# Patient Record
Sex: Male | Born: 1980 | Race: White | Hispanic: No | Marital: Single | Smoking: Former smoker
Health system: Southern US, Community
[De-identification: ages and names within clinical notes are randomized; demographics above are authoritative.]

## PROBLEM LIST (undated history)

## (undated) DIAGNOSIS — K219 Gastro-esophageal reflux disease without esophagitis: Secondary | ICD-10-CM

## (undated) DIAGNOSIS — R55 Syncope and collapse: Secondary | ICD-10-CM

## (undated) DIAGNOSIS — M5432 Sciatica, left side: Secondary | ICD-10-CM

## (undated) DIAGNOSIS — M51369 Other intervertebral disc degeneration, lumbar region without mention of lumbar back pain or lower extremity pain: Secondary | ICD-10-CM

## (undated) DIAGNOSIS — M5126 Other intervertebral disc displacement, lumbar region: Secondary | ICD-10-CM

## (undated) DIAGNOSIS — M5136 Other intervertebral disc degeneration, lumbar region: Secondary | ICD-10-CM

## (undated) DIAGNOSIS — J36 Peritonsillar abscess: Secondary | ICD-10-CM

## (undated) DIAGNOSIS — I471 Supraventricular tachycardia, unspecified: Secondary | ICD-10-CM

## (undated) DIAGNOSIS — Z950 Presence of cardiac pacemaker: Secondary | ICD-10-CM

## (undated) DIAGNOSIS — Z8619 Personal history of other infectious and parasitic diseases: Secondary | ICD-10-CM

## (undated) HISTORY — DX: Other intervertebral disc degeneration, lumbar region: M51.36

## (undated) HISTORY — DX: Sciatica, left side: M54.32

## (undated) HISTORY — PX: WISDOM TOOTH EXTRACTION: SHX21

## (undated) HISTORY — DX: Other intervertebral disc displacement, lumbar region: M51.26

## (undated) HISTORY — PX: OTHER SURGICAL HISTORY: SHX169

## (undated) HISTORY — DX: Personal history of other infectious and parasitic diseases: Z86.19

## (undated) HISTORY — DX: Other intervertebral disc degeneration, lumbar region without mention of lumbar back pain or lower extremity pain: M51.369

## (undated) HISTORY — DX: Gastro-esophageal reflux disease without esophagitis: K21.9

---

## 2000-09-18 ENCOUNTER — Encounter: Payer: Self-pay | Admitting: Emergency Medicine

## 2000-09-18 ENCOUNTER — Emergency Department (HOSPITAL_COMMUNITY): Admission: AC | Admit: 2000-09-18 | Discharge: 2000-09-18 | Payer: Self-pay

## 2001-03-08 ENCOUNTER — Emergency Department (HOSPITAL_COMMUNITY): Admission: EM | Admit: 2001-03-08 | Discharge: 2001-03-08 | Payer: Self-pay

## 2006-08-25 ENCOUNTER — Emergency Department (HOSPITAL_COMMUNITY): Admission: EM | Admit: 2006-08-25 | Discharge: 2006-08-25 | Payer: Self-pay | Admitting: Emergency Medicine

## 2008-01-01 ENCOUNTER — Emergency Department (HOSPITAL_COMMUNITY): Admission: EM | Admit: 2008-01-01 | Discharge: 2008-01-01 | Payer: Self-pay | Admitting: Emergency Medicine

## 2008-08-11 ENCOUNTER — Emergency Department (HOSPITAL_COMMUNITY): Admission: EM | Admit: 2008-08-11 | Discharge: 2008-08-11 | Payer: Self-pay | Admitting: Emergency Medicine

## 2010-04-11 LAB — DIFFERENTIAL
Basophils Absolute: 0 10*3/uL (ref 0.0–0.1)
Basophils Relative: 0 % (ref 0–1)
Eosinophils Absolute: 0.1 10*3/uL (ref 0.0–0.7)
Eosinophils Relative: 1 % (ref 0–5)
Lymphocytes Relative: 22 % (ref 12–46)

## 2010-04-11 LAB — BASIC METABOLIC PANEL
BUN: 11 mg/dL (ref 6–23)
CO2: 27 mEq/L (ref 19–32)
GFR calc non Af Amer: >60 mL/min (ref >60)
Glucose, Bld: 86 mg/dL (ref 70–99)
Potassium: 4.1 mEq/L (ref 3.5–5.1)

## 2010-04-11 LAB — CBC
HCT: 42.7 % (ref 39.0–52.0)
MCHC: 33.5 g/dL (ref 30.0–36.0)
MCV: 89.8 fL (ref 78.0–100.0)
Platelets: 247 10*3/uL (ref 150–400)
RDW: 12.9 % (ref 11.5–15.5)

## 2010-04-11 LAB — GLUCOSE, CAPILLARY: Glucose-Capillary: 84 mg/dL (ref 70–99)

## 2011-11-22 DIAGNOSIS — Z8249 Family history of ischemic heart disease and other diseases of the circulatory system: Secondary | ICD-10-CM | POA: Insufficient documentation

## 2012-01-04 HISTORY — PX: PACEMAKER INSERTION: SHX728

## 2012-11-09 DIAGNOSIS — Z950 Presence of cardiac pacemaker: Secondary | ICD-10-CM | POA: Insufficient documentation

## 2013-03-18 DIAGNOSIS — I471 Supraventricular tachycardia: Secondary | ICD-10-CM | POA: Insufficient documentation

## 2013-03-18 DIAGNOSIS — I82409 Acute embolism and thrombosis of unspecified deep veins of unspecified lower extremity: Secondary | ICD-10-CM | POA: Insufficient documentation

## 2013-09-09 ENCOUNTER — Emergency Department (HOSPITAL_COMMUNITY)
Admission: EM | Admit: 2013-09-09 | Discharge: 2013-09-09 | Disposition: A | Discharge status: Home / Self Care | Payer: BC Managed Care – PPO | Attending: Emergency Medicine | Admitting: Emergency Medicine

## 2013-09-09 ENCOUNTER — Encounter (HOSPITAL_COMMUNITY): Payer: Self-pay | Admitting: Emergency Medicine

## 2013-09-09 DIAGNOSIS — Z79899 Other long term (current) drug therapy: Secondary | ICD-10-CM | POA: Insufficient documentation

## 2013-09-09 DIAGNOSIS — J029 Acute pharyngitis, unspecified: Secondary | ICD-10-CM | POA: Diagnosis present

## 2013-09-09 DIAGNOSIS — J36 Peritonsillar abscess: Secondary | ICD-10-CM | POA: Diagnosis not present

## 2013-09-09 DIAGNOSIS — I498 Other specified cardiac arrhythmias: Secondary | ICD-10-CM | POA: Insufficient documentation

## 2013-09-09 HISTORY — DX: Syncope and collapse: R55

## 2013-09-09 HISTORY — DX: Supraventricular tachycardia, unspecified: I47.10

## 2013-09-09 HISTORY — DX: Supraventricular tachycardia: I47.1

## 2013-09-09 MED ORDER — PENICILLIN G POTASSIUM 5000000 UNITS IJ SOLR
2.5000 10*6.[IU] | Freq: Once | INTRAVENOUS | Status: AC
  Administered 2013-09-09: 2.5 10*6.[IU] via INTRAVENOUS
  Filled 2013-09-09: qty 2.5

## 2013-09-09 MED ORDER — AMOXICILLIN 250 MG/5ML PO SUSR: 500.0000 mg | Freq: Three times a day (TID) | ORAL | Status: DC

## 2013-09-09 MED ORDER — AMOXICILLIN 250 MG/5ML PO SUSR: 250.0000 mg | Freq: Three times a day (TID) | ORAL | Status: DC

## 2013-09-09 MED ORDER — LIDOCAINE-EPINEPHRINE (PF) 1 %-1:200000 IJ SOLN
INTRAMUSCULAR | Status: AC
  Administered 2013-09-09: 12:00:00
  Filled 2013-09-09: qty 10

## 2013-09-09 MED ORDER — AMOXICILLIN 250 MG/5ML PO SUSR: 1000.0000 mg | Freq: Three times a day (TID) | ORAL | Status: DC

## 2013-09-09 MED ORDER — HYDROCODONE-ACETAMINOPHEN 7.5-325 MG/15ML PO SOLN: 10.0000 mL | ORAL | Status: DC | PRN

## 2013-09-09 MED ORDER — HYDROCODONE-ACETAMINOPHEN 7.5-325 MG/15ML PO SOLN: 15.0000 mL | ORAL | Status: DC | PRN

## 2013-09-09 MED ORDER — AMOXICILLIN 250 MG/5ML PO SUSR: ORAL | Status: DC

## 2013-09-09 NOTE — ED Provider Notes (Signed)
CSN: [id number]     Arrival date & time 09/09/13  0847 History   First MD Initiated Contact with Patient 09/09/13 [phone]     Chief Complaint  Patient presents with  . Sore Throat      HPI   patient complains of a sore throat for 11 days. Seen at urgent care 2 days ago and placed on 5 days of Biaxin. After 5 days was no better. Return. Placed on Levaquin.  Fifth dose of Levaquin last night he continues to have right-sided peritonsillar pain. Pain with swallowing. Minimal trismus. No fevers no chills. History of a peritonsillar abscess drained many years ago.  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 Otherwise healthy.  Past Medical History  Diagnosis Date  . SVT (supraventricular tachycardia)   . Vaso vagal episode    Past Surgical History  Procedure Laterality Date  . Pacemaker insertion     No family history on file. History  Substance Use Topics  . Smoking status: Never Smoker   . Smokeless tobacco: Not on file  . Alcohol Use: No    Review of Systems  Constitutional: Negative for fever, chills, diaphoresis, appetite change and fatigue.  HENT: Positive for sore throat, trouble swallowing and voice change. Negative for mouth sores.   Eyes: Negative for visual disturbance.  Respiratory: Negative for cough, chest tightness, shortness of breath and wheezing.   Cardiovascular: Negative for chest pain.  Gastrointestinal: Negative for nausea, vomiting, abdominal pain, diarrhea and abdominal distention.  Endocrine: Negative for polydipsia, polyphagia and polyuria.  Genitourinary: Negative for dysuria, frequency and hematuria.  Musculoskeletal: Negative for gait problem.  Skin: Negative for color change, pallor and rash.  Neurological: Negative for dizziness, syncope, light-headedness and headaches.  Hematological: Does not bruise/bleed easily.  Psychiatric/Behavioral: Negative for behavioral problems and confusion.      Allergies    Review of patient's allergies indicates no known allergies.  Home Medications   Prior to Admission medications   Medication Sig Start Date End Date Taking? Authorizing Provider  escitalopram (LEXAPRO) 10 MG tablet Take 10 mg by mouth daily.   Yes Historical Provider, MD  ibuprofen (ADVIL,MOTRIN) 200 MG tablet Take 400 mg by mouth every 6 (six) hours as needed for mild pain or moderate pain.   Yes Historical Provider, MD  risperiDONE (RISPERDAL) 0.5 MG tablet Take 0.5 mg by mouth at bedtime.   Yes Historical Provider, MD  amoxicillin (AMOXIL) 250 MG/5ML suspension Take 20 mLs (1,000 mg total) by mouth every 8 (eight) hours. 09/09/13   Flo Shanks, MD  amoxicillin (AMOXIL) 250 MG/5ML suspension 1g po tid x 3 days, then 750 mg po tid x 7 days  Disp QS x 9 days 09/09/13   Rolland Porter, MD  HYDROcodone-acetaminophen (HYCET) 7.5-325 mg/15 ml solution Take 10-20 mLs by mouth every 4 (four) hours as needed for moderate pain. 09/09/13   Flo Shanks, MD  HYDROcodone-acetaminophen (HYCET) 7.5-325 mg/15 ml solution Take 15 mLs by mouth every 4 (four) hours as needed for moderate pain or severe pain. 09/09/13   Rolland Porter, MD   BP 123/75  Pulse 69  Temp(Src) 98.5 F (36.9 C) (Oral)  Resp 16  SpO2 99% Physical Exam  Constitutional: He is oriented to person, place, and time. He appears well-developed and well-nourished. No distress.  HENT:  Head: Normocephalic.  Mouth/Throat:    Eyes: Conjunctivae are normal. Pupils are equal, round, and reactive to light. No scleral icterus.  Neck: Normal  range of motion. Neck supple. No thyromegaly present.  Cardiovascular: Normal rate and regular rhythm.  Exam reveals no gallop and no friction rub.   No murmur heard. Pulmonary/Chest: Effort normal and breath sounds normal. No respiratory distress. He has no wheezes. He has no rales.  Abdominal: Soft. Bowel sounds are normal. He exhibits no distension. There is no tenderness. There is no rebound.  Musculoskeletal:  Normal range of motion.  Neurological: He is alert and oriented to person, place, and time.  Skin: Skin is warm and dry. No rash noted.  Psychiatric: He has a normal mood and affect. His behavior is normal.    ED Course  Procedures (including critical care time) Labs Review Labs Reviewed - No data to display  Imaging Review No results found.   EKG Interpretation None      MDM   Final diagnoses:  Peritonsillar abscess   IV is placed. Given IV penicillin. Discussed case with Dr. Lazarus Salines. He is in route planning incision and drainage.    Rolland Porter, MD 09/10/13 [phone]

## 2013-09-09 NOTE — Discharge Instructions (Signed)
Call Dr. Lazarus Salines for follow up appointment.  Peritonsillar Cellulitis Peritonsillar cellulitis is an infection around a tonsil. This infection usually affects just one of the two tonsils. The result is a severe sore throat. Peritonsillar cellulitis can develop at any age. It often develops in individuals who have had frequent sore throats and who have frequently taken antibiotics. CAUSES  Peritonsillar cellulitis is usually caused by more than one type of germ (bacteria). SYMPTOMS  At first, it might seem like a regular sore throat. But a sore throat from peritonsillar cellulitis does not go away in a few days. Instead, it gets worse.  Early symptoms of peritonsillar cellulitis may include:  Fever and/or chills.  A throat that is sore on one side only.  Pain in one ear.  Pain when swallowing.  Feeling more tired than usual.  Later symptoms may include:  Severe pain when swallowing.  Drooling.  Trouble opening the mouth wide.  Bad breath.  Voice changes. DIAGNOSIS  In most cases, your caregiver can make the diagnosis by knowing your symptoms, examining your throat and getting a throat culture. Blood samples may also help to determine the cause of your sore throat. TREATMENT  This is not an ordinary sore throat. It is a condition that needs to be treated quickly. If it is not treated, swelling and pus (an abscess) can develop.  Peritonsillar cellulitis is usually treated with antibiotics. These infections require oral antibiotics for a full 10 days and/or antibiotics given into the vein (intravenous, IV).  Medications may be prescribed for pain or fever.  Sometimes, medications that fight swelling (steroids) are prescribed.  If an abscess has formed, the abscess may need to be drained.  Individuals who have repeated cases of peritonsillar cellulitis may need an operation to remove the tonsils (tonsillectomy). HOME CARE INSTRUCTIONS   Take antibiotics as directed by  your caregiver. Take all the antibiotics, even if you start to feel better.  Some pain is normal with this condition. Take pain medication as directed by your caregiver. Do not take any other pain medications unless approved by your caregiver.  Gargle with warm salt water. Use 1 teaspoon (5 grams) salt mixed in 1 cup (250 mL) of warm water. Gargle for 30 seconds or more before spitting the solution out. Gargle 3 to 4 times a day or as needed. This may help ease pain and swelling.  A liquid or soft food diet may be necessary if it is hard to swallow.  It is important to drink fluids. Drink enough water and fluids to keep your urine clear or pale yellow.  Do not smoke.  Rest and get plenty of sleep.  If your caregiver has given you a follow-up appointment, it is very important to keep that appointment. Your caregiver will need to make sure that the infection is getting better. It is important to check that an abscess is not forming.  Return to work or school as directed by your caregiver. SEEK MEDICAL CARE IF:   Your swelling increases.  You have difficulty swallowing.  You are unable to take your antibiotic. SEEK IMMEDIATE MEDICAL CARE IF:   You have trouble breathing.  Your pain gets worse even after taking pain medicine.  You see pus around or near the tonsils.  Your voice changes.  You are drooling.  You cough up bloody sputum.  You are unable to swallow.  You have a fever. MAKE SURE YOU:   Understand these instructions.  Will watch your condition.  Will get help right away if you are not doing well or get worse. Document Released: 03/16/2009 Document Revised: 05/06/2013 Document Reviewed: 03/16/2009 [facility] Patient Information 2015 [place], [place]. This information is not intended to replace advice given to you by your health care provider. Make sure you discuss any questions you have with your health care provider.  Liquid hydrocodone with tylenol for pain  relief Liquid Amoxicillin antibiotic Recheck my office 2 weeks ([phone]) Call for bleeding, worsening of throat pain Return to work/school tomorrow or Wednesday

## 2013-09-09 NOTE — ED Notes (Signed)
ENT MD at bedside.

## 2013-09-09 NOTE — Consult Note (Signed)
Keith Underwood, Keith Underwood 33 y.o., male [id number]     Chief Complaint:  RIGHT peritonsillar abscess  HPI: 33 yo wm, onset URI 12 days ago, then diffuse sore throat.  Localized to RIGHT side x 9-10 days.  Painful to swallow.  No breathing difficulty.  Seen twice at Urgent Care.  Given Biaxin with no relief, then Levaquin also with no relief.  No documented fever.  Pt.  Has past hx PTA, RIGHT, I&D'd ~15 yrs ago. Also one or two episodes tonsillitis per year.  Has recent pacemaker for vasovagal syncope.  PMH: Past Medical History  Diagnosis Date  . SVT (supraventricular tachycardia)   . Vaso vagal episode     Surg Hx: Past Surgical History  Procedure Laterality Date  . Pacemaker insertion      FHx:  No family history on file. SocHx:  reports that he has never smoked. He does not have any smokeless tobacco history on file. He reports that he does not drink alcohol. His drug history is not on file.  ALLERGIES: No Known Allergies   (Not in a hospital admission)  No results found for this or any previous visit (from the past 48 hour(s)). No results found.  ROS:No SOB, chest pain.   Blood pressure 123/76, pulse 73, temperature 98.7 F (37.1 C), temperature source Oral, resp. rate 16, SpO2 97.00%.  PHYSICAL EXAM: Overall appearance:  Distressed. No hot potato voice, no significant fetor oris.  Not warm to touch. Overall physically robust Head:  NCAT Ears:  clear Nose:  clear Oral Cavity:  Bulging RIGHT soft palate. 2+ tonsils Oral Pharynx/Hypopharynx/Larynx:  See above Neuro:  Grossly intact Neck:  Tender RIGHT JDG nodes  Studies Reviewed: none    Assessment/Plan RIGHT peritonsillar abscess  With informed consent, anesthetized the RIGHT peritonsillar area with Cetacaine spray, followed by 6 ml 2 % xylocaine with 1:200,000 epi.  Several minutes were allowed for this to take effect.    A 2 cm crescent incision was performed.  Frank pus encountered, 15-20 ml estimated.   Cavity widely opened.    Pt tolerated well.  Hemostasis spontaneous.    Will send home with liquid Amoxicillin and liquid hydrocodone.  Recheck my office 2 weeks.  Lazarus Salines, Daralyn Bert 09/09/2013, 11:30 AM

## 2013-09-09 NOTE — ED Notes (Signed)
Pt c/o right side throat pain, hurts to touch and swallow x 1 week. Pt coughing up clear sputum.

## 2013-10-22 ENCOUNTER — Emergency Department (HOSPITAL_COMMUNITY): Payer: BC Managed Care – PPO

## 2013-10-22 ENCOUNTER — Encounter (HOSPITAL_COMMUNITY): Payer: Self-pay | Admitting: Emergency Medicine

## 2013-10-22 ENCOUNTER — Emergency Department (HOSPITAL_COMMUNITY)
Admission: EM | Admit: 2013-10-22 | Discharge: 2013-10-22 | Disposition: A | Discharge status: Home / Self Care | Payer: BC Managed Care – PPO | Attending: Emergency Medicine | Admitting: Emergency Medicine

## 2013-10-22 DIAGNOSIS — Y93E1 Activity, personal bathing and showering: Secondary | ICD-10-CM | POA: Insufficient documentation

## 2013-10-22 DIAGNOSIS — Y9289 Other specified places as the place of occurrence of the external cause: Secondary | ICD-10-CM | POA: Diagnosis not present

## 2013-10-22 DIAGNOSIS — W19XXXA Unspecified fall, initial encounter: Secondary | ICD-10-CM

## 2013-10-22 DIAGNOSIS — W182XXA Fall in (into) shower or empty bathtub, initial encounter: Secondary | ICD-10-CM | POA: Insufficient documentation

## 2013-10-22 DIAGNOSIS — Z8679 Personal history of other diseases of the circulatory system: Secondary | ICD-10-CM | POA: Insufficient documentation

## 2013-10-22 DIAGNOSIS — Z79899 Other long term (current) drug therapy: Secondary | ICD-10-CM | POA: Diagnosis not present

## 2013-10-22 DIAGNOSIS — S3992XA Unspecified injury of lower back, initial encounter: Secondary | ICD-10-CM | POA: Diagnosis present

## 2013-10-22 DIAGNOSIS — W01198A Fall on same level from slipping, tripping and stumbling with subsequent striking against other object, initial encounter: Secondary | ICD-10-CM | POA: Diagnosis not present

## 2013-10-22 DIAGNOSIS — R531 Weakness: Secondary | ICD-10-CM | POA: Diagnosis not present

## 2013-10-22 DIAGNOSIS — M5432 Sciatica, left side: Secondary | ICD-10-CM

## 2013-10-22 DIAGNOSIS — R202 Paresthesia of skin: Secondary | ICD-10-CM | POA: Diagnosis not present

## 2013-10-22 MED ORDER — OXYCODONE-ACETAMINOPHEN 5-325 MG PO TABS: 1.0000 | ORAL_TABLET | Freq: Four times a day (QID) | ORAL | Status: DC | PRN

## 2013-10-22 MED ORDER — IBUPROFEN 600 MG PO TABS: 600.0000 mg | ORAL_TABLET | Freq: Four times a day (QID) | ORAL | Status: DC | PRN

## 2013-10-22 MED ORDER — KETOROLAC TROMETHAMINE 30 MG/ML IJ SOLN
30.0000 mg | Freq: Once | INTRAMUSCULAR | Status: AC
  Administered 2013-10-22: 30 mg via INTRAVENOUS
  Filled 2013-10-22: qty 1

## 2013-10-22 MED ORDER — HYDROMORPHONE HCL 1 MG/ML IJ SOLN
1.0000 mg | Freq: Once | INTRAMUSCULAR | Status: AC
  Administered 2013-10-22: 1 mg via INTRAVENOUS
  Filled 2013-10-22: qty 1

## 2013-10-22 NOTE — ED Provider Notes (Signed)
CSN: [id number]     Arrival date & time 10/22/13  1050 History   First MD Initiated Contact with Patient 10/22/13 1104     Chief Complaint  Patient presents with  . Fall     (Consider location/radiation/quality/duration/timing/severity/associated sxs/prior Treatment) HPI  This is a 33 year old male who presents following a fall. He states he slipped and fell when he came out of the shower. He hit lower back and immediately had onset of pain and paresthesias down his left leg. He has not attempted to the ambulatory. He did not hit his head or lose consciousness. He denies any syncope, chest pain, shortness of breath during the event.  He reports difficulty moving his left lower extremity. He rates his pain at 10. He has a history of degenerative disc disease and was followed by orthopedics in [place].  Denies loss of bowel or bladder.  Past Medical History  Diagnosis Date  . SVT (supraventricular tachycardia)   . Vaso vagal episode    Past Surgical History  Procedure Laterality Date  . Pacemaker insertion     No family history on file. History  Substance Use Topics  . Smoking status: Never Smoker   . Smokeless tobacco: Not on file  . Alcohol Use: No    Review of Systems  Respiratory: Negative.  Negative for chest tightness and shortness of breath.   Cardiovascular: Negative.  Negative for chest pain.  Gastrointestinal: Negative.   Genitourinary: Negative.   Musculoskeletal: Positive for back pain.  Skin: Negative for rash.  Neurological: Positive for weakness and numbness. Negative for headaches.  All other systems reviewed and are negative.     Allergies  Review of patient's allergies indicates no known allergies.  Home Medications   Prior to Admission medications   Medication Sig Start Date End Date Taking? Authorizing Provider  FLUoxetine (PROZAC) 10 MG capsule Take 10 mg by mouth at bedtime.   Yes Historical Provider, MD  ibuprofen (ADVIL,MOTRIN) 200 MG tablet  Take 400 mg by mouth every 6 (six) hours as needed for headache, mild pain or moderate pain.    Yes Historical Provider, MD  risperiDONE (RISPERDAL) 0.5 MG tablet Take 0.5 mg by mouth at bedtime.   Yes Historical Provider, MD  ibuprofen (ADVIL,MOTRIN) 600 MG tablet Take 1 tablet (600 mg total) by mouth every 6 (six) hours as needed. 10/22/13   Shon Batonourtney F Pepe Mineau, MD  oxyCODONE-acetaminophen (PERCOCET/ROXICET) 5-325 MG per tablet Take 1-2 tablets by mouth every 6 (six) hours as needed for moderate pain or severe pain. 10/22/13   Shon Batonourtney F Amaria Mundorf, MD   BP 130/75  Pulse 63  Temp(Src) 97.8 F (36.6 C) (Oral)  Resp 16  SpO2 100% Physical Exam  Nursing note and vitals reviewed. Constitutional: He is oriented to person, place, and time. He appears well-developed and well-nourished. No distress.  HENT:  Head: Normocephalic and atraumatic.  Eyes: Pupils are equal, round, and reactive to light.  Cardiovascular: Normal rate, regular rhythm and normal heart sounds.   No murmur heard. Pulmonary/Chest: Effort normal and breath sounds normal. No respiratory distress. He has no wheezes.  Abdominal: Soft. Bowel sounds are normal. There is no tenderness. There is no rebound.  Genitourinary:  Normal rectal tone  Musculoskeletal: He exhibits no edema.  Normal passive range of motion of the bilateral hips and knees, no clonus noted, decreased strength in the left lower extremity with dorsi and plantar flexion as well as hip flexion which appears to be somewhat functional  Neurological: He is alert and oriented to person, place, and time.  Skin: Skin is warm and dry.  Psychiatric: He has a normal mood and affect.    ED Course  Procedures (including critical care time) Labs Review Labs Reviewed - No data to display  Imaging Review Dg Pelvis 1-2 Views  10/22/2013   CLINICAL DATA:  33 year old male with fall in shower and low back pain.  EXAM: PELVIS - 1-2 VIEW  COMPARISON:  Contemporaneous spine CT   FINDINGS: Bony pelvic ring is intact without acute bony abnormality. Bilateral hips projects normally over the acetabula. Visualized proximal femurs unremarkable. Unremarkable appearance of the sacroiliac joints and the lower lumbar spine.  Unremarkable bowel gas pattern.  IMPRESSION: No acute abnormality.  Signed,  Yvone Neu. Loreta Ave, DO  [facility]  [facility]   Electronically Signed   By: Gilmer Mor D.O.   On: 10/22/2013 13:13   Ct Thoracic Spine Wo Contrast  10/22/2013   CLINICAL DATA:  Slipped and fell in the bathroom. Severe back and left leg pain and left leg numbness.  EXAM: CT THORACIC AND LUMBAR SPINE WITHOUT CONTRAST  TECHNIQUE: Multidetector CT imaging of the thoracic and lumbar spine was performed without contrast. Multiplanar CT image reconstructions were also generated.  COMPARISON:  None.  FINDINGS: CT THORACIC SPINE FINDINGS  Normal alignment of the thoracic vertebral bodies. No acute compression fracture is identified. The facets are normally aligned. No posterior element fractures. No abnormal paraspinal soft tissue swelling or hematoma. The visualized posterior ribs are intact and the visualized lungs are clear.  CT LUMBAR SPINE FINDINGS  The lumbar vertebral bodies are normally aligned. No acute compression fracture. The facets are normally aligned. No posterior element fractures. No pars defects. No abnormal paraspinal soft tissue swelling. No retroperitoneal findings. No large disc protrusions, spinal or foraminal stenosis. There are shallow broad-based disc protrusions at L4-5 and L5-S1 with mild impression on the thecal sac. No foraminal stenosis.  IMPRESSION: CT THORACIC SPINE IMPRESSION  Normal alignment and no acute bony findings.  CT LUMBAR SPINE IMPRESSION  Normal alignment and no acute bony findings.  Shallow broad-based disc protrusions at L4-5 and L5-S1.   Electronically Signed   By: Loralie Champagne M.D.   On: 10/22/2013 12:02    Ct Lumbar Spine Wo Contrast  10/22/2013   CLINICAL DATA:  Slipped and fell in the bathroom. Severe back and left leg pain and left leg numbness.  EXAM: CT THORACIC AND LUMBAR SPINE WITHOUT CONTRAST  TECHNIQUE: Multidetector CT imaging of the thoracic and lumbar spine was performed without contrast. Multiplanar CT image reconstructions were also generated.  COMPARISON:  None.  FINDINGS: CT THORACIC SPINE FINDINGS  Normal alignment of the thoracic vertebral bodies. No acute compression fracture is identified. The facets are normally aligned. No posterior element fractures. No abnormal paraspinal soft tissue swelling or hematoma. The visualized posterior ribs are intact and the visualized lungs are clear.  CT LUMBAR SPINE FINDINGS  The lumbar vertebral bodies are normally aligned. No acute compression fracture. The facets are normally aligned. No posterior element fractures. No pars defects. No abnormal paraspinal soft tissue swelling. No retroperitoneal findings. No large disc protrusions, spinal or foraminal stenosis. There are shallow broad-based disc protrusions at L4-5 and L5-S1 with mild impression on the thecal sac. No foraminal stenosis.  IMPRESSION: CT THORACIC SPINE IMPRESSION  Normal alignment and no acute bony findings.  CT LUMBAR SPINE IMPRESSION  Normal alignment and no acute bony findings.  Shallow broad-based disc protrusions at L4-5 and L5-S1.   Electronically Signed   By: Loralie ChampagneMark  Gallerani M.D.   On: 10/22/2013 12:02     EKG Interpretation None      MDM   Final diagnoses:  Fall, initial encounter  Sciatica, left    Patient presents with a fall. Reports hitting his back and now reports weakness and numbness in the left lower extremity. Decreased strength in the left lower extremity which may be effort dependent. Patient has good rectal tone. Given complaint however, will obtain CT T. and L-spine to rule out acute fracture causing cord compression. CTs are reassuring. They do note  disc protrusions at L4, L5, and S1. On repeat exam, patient noted to spontaneously move left lower extremity. No other obvious injury on exam. He was given pain control and was able to ambulate. No evidence of cauda equina at this time. No indication for emergent MRI.  Discuss with patient pain control at home. He was referred to primary doctor as well as an orthopedist in [place] per his request.  After history, exam, and medical workup I feel the patient has been appropriately medically screened and is safe for discharge home. Pertinent diagnoses were discussed with the patient. Patient was given return precautions.     Shon Batonourtney F Harwood Nall, MD 10/23/13 [phone]

## 2013-10-22 NOTE — ED Notes (Signed)
Pt. Walk well in the hallway with two person assist. He stated that his leg felt numb but he was able to put pressure on it. As he walked he was having lower back pain.

## 2013-10-22 NOTE — ED Notes (Signed)
Bed: WA21 Expected date:  Expected time:  Means of arrival:  Comments: ems- fell, back pain. Radiates up spine, some time of pacemaker

## 2013-10-22 NOTE — ED Notes (Signed)
Pt states slipped in water; laid in floor x 10 minutes then called 911; c/o left leg feeling numb; states feels cold and tingly; denies pain; denies any other injury; states didn't hit anything on the way down; denies loc; states landed on lower back; no obvious injury noted; Dr Wilkie AyeHorton states ok to remove from lsb; pt removed from lsb with assistance; no obvious injury noted; no bruising or deformity noted; no crepitus or step off noted down spinal column; pt tolerated without difficulty; pt skin warm and dry; talkative and pleasant; c collar remains in place; pt feels need to void; urinal given

## 2013-10-22 NOTE — Discharge Instructions (Signed)
Sciatica Sciatica is pain, weakness, numbness, or tingling along the path of the sciatic nerve. The nerve starts in the lower back and runs down the back of each leg. The nerve controls the muscles in the lower leg and in the back of the knee, while also providing sensation to the back of the thigh, lower leg, and the sole of your foot. Sciatica is a symptom of another medical condition. For instance, nerve damage or certain conditions, such as a herniated disk or bone spur on the spine, pinch or put pressure on the sciatic nerve. This causes the pain, weakness, or other sensations normally associated with sciatica. Generally, sciatica only affects one side of the body. CAUSES   Herniated or slipped disc.  Degenerative disk disease.  A pain disorder involving the narrow muscle in the buttocks (piriformis syndrome).  Pelvic injury or fracture.  Pregnancy.  Tumor (rare). SYMPTOMS  Symptoms can vary from mild to very severe. The symptoms usually travel from the low back to the buttocks and down the back of the leg. Symptoms can include:  Mild tingling or dull aches in the lower back, leg, or hip.  Numbness in the back of the calf or sole of the foot.  Burning sensations in the lower back, leg, or hip.  Sharp pains in the lower back, leg, or hip.  Leg weakness.  Severe back pain inhibiting movement. These symptoms may get worse with coughing, sneezing, laughing, or prolonged sitting or standing. Also, being overweight may worsen symptoms. DIAGNOSIS  Your caregiver will perform a physical exam to look for common symptoms of sciatica. He or she may ask you to do certain movements or activities that would trigger sciatic nerve pain. Other tests may be performed to find the cause of the sciatica. These may include:  Blood tests.  X-rays.  Imaging tests, such as an MRI or CT scan. TREATMENT  Treatment is directed at the cause of the sciatic pain. Sometimes, treatment is not necessary  and the pain and discomfort goes away on its own. If treatment is needed, your caregiver may suggest:  Over-the-counter medicines to relieve pain.  Prescription medicines, such as anti-inflammatory medicine, muscle relaxants, or narcotics.  Applying heat or ice to the painful area.  Steroid injections to lessen pain, irritation, and inflammation around the nerve.  Reducing activity during periods of pain.  Exercising and stretching to strengthen your abdomen and improve flexibility of your spine. Your caregiver may suggest losing weight if the extra weight makes the back pain worse.  Physical therapy.  Surgery to eliminate what is pressing or pinching the nerve, such as a bone spur or part of a herniated disk. HOME CARE INSTRUCTIONS   Only take over-the-counter or prescription medicines for pain or discomfort as directed by your caregiver.  Apply ice to the affected area for 20 minutes, 3-4 times a day for the first 48-72 hours. Then try heat in the same way.  Exercise, stretch, or perform your usual activities if these do not aggravate your pain.  Attend physical therapy sessions as directed by your caregiver.  Keep all follow-up appointments as directed by your caregiver.  Do not wear high heels or shoes that do not provide proper support.  Check your mattress to see if it is too soft. A firm mattress may lessen your pain and discomfort. SEEK IMMEDIATE MEDICAL CARE IF:   You lose control of your bowel or bladder (incontinence).  You have increasing weakness in the lower back, pelvis, buttocks,   or legs.  You have redness or swelling of your back.  You have a burning sensation when you urinate.  You have pain that gets worse when you lie down or awakens you at night.  Your pain is worse than you have experienced in the past.  Your pain is lasting longer than 4 weeks.  You are suddenly losing weight without reason. MAKE SURE YOU:  Understand these  instructions.  Will watch your condition.  Will get help right away if you are not doing well or get worse. Document Released: 12/14/2000 Document Revised: 06/21/2011 Document Reviewed: 05/01/2011 [employer] Patient Information 2015 [employer]. This information is not intended to replace advice given to you by your health care provider. Make sure you discuss any questions you have with your health care provider.  

## 2013-10-22 NOTE — ED Notes (Signed)
Misty StanleyLisa in [place] notified of need to do CT scan ASAP; strict spine precautions noted per Dr Wilkie AyeHorton

## 2013-10-22 NOTE — ED Notes (Signed)
Per EMS; pt in bathroom slipped and fell in water; called ems due to c/o severe pain in mid thoracic down to lower lumbar region; numbness in left leg; history of back pain; patient can feel pressure; feels like leg is asleep; feels tingly; pt fully immobilized on arrival; neg loc; neg neck; no deformity or crepitus noted per EMS

## 2014-09-19 ENCOUNTER — Encounter: Payer: Self-pay | Admitting: Physician Assistant

## 2014-09-19 ENCOUNTER — Ambulatory Visit (INDEPENDENT_AMBULATORY_CARE_PROVIDER_SITE_OTHER): Payer: Managed Care, Other (non HMO) | Admitting: Physician Assistant

## 2014-09-19 VITALS — BP 124/82 | HR 97 | Temp 98.3°F | Resp 16 | Ht 70.5 in | Wt 198.0 lb

## 2014-09-19 DIAGNOSIS — F319 Bipolar disorder, unspecified: Secondary | ICD-10-CM | POA: Insufficient documentation

## 2014-09-19 DIAGNOSIS — Z23 Encounter for immunization: Secondary | ICD-10-CM | POA: Diagnosis not present

## 2014-09-19 DIAGNOSIS — I471 Supraventricular tachycardia: Secondary | ICD-10-CM

## 2014-09-19 DIAGNOSIS — F3130 Bipolar disorder, current episode depressed, mild or moderate severity, unspecified: Secondary | ICD-10-CM | POA: Diagnosis not present

## 2014-09-19 DIAGNOSIS — Z0000 Encounter for general adult medical examination without abnormal findings: Secondary | ICD-10-CM

## 2014-09-19 LAB — CBC
HCT: 45.9 % (ref 39.0–52.0)
Hemoglobin: 15.5 g/dL (ref 13.0–17.0)
MCHC: 33.8 g/dL (ref 30.0–36.0)
MCV: 90.6 fl (ref 78.0–100.0)
PLATELETS: 298 10*3/uL (ref 150.0–400.0)
RBC: 5.07 Mil/uL (ref 4.22–5.81)
RDW: 13.2 % (ref 11.5–15.5)
WBC: 8.1 10*3/uL (ref 4.0–10.5)

## 2014-09-19 LAB — LIPID PANEL
CHOLESTEROL: 164 mg/dL (ref 0–200)
HDL: 51.4 mg/dL (ref >39.00)
LDL Cholesterol: 89 mg/dL (ref 0–99)
NonHDL: 112.28
Total CHOL/HDL Ratio: 3
Triglycerides: 115 mg/dL (ref 0.0–149.0)
VLDL: 23 mg/dL (ref 0.0–40.0)

## 2014-09-19 LAB — COMPREHENSIVE METABOLIC PANEL
ALT: 24 U/L (ref 0–53)
AST: 22 U/L (ref 0–37)
Albumin: 4.8 g/dL (ref 3.5–5.2)
Alkaline Phosphatase: 79 U/L (ref 39–117)
BUN: 13 mg/dL (ref 6–23)
CALCIUM: 10.7 mg/dL — AB (ref 8.4–10.5)
CHLORIDE: 100 meq/L (ref 96–112)
CO2: 32 meq/L (ref 19–32)
CREATININE: 0.91 mg/dL (ref 0.40–1.50)
GFR: 100.77 mL/min (ref >60.00)
Glucose, Bld: 82 mg/dL (ref 70–99)
POTASSIUM: 4.6 meq/L (ref 3.5–5.1)
Sodium: 138 mEq/L (ref 135–145)
Total Bilirubin: 0.4 mg/dL (ref 0.2–1.2)
Total Protein: 7.7 g/dL (ref 6.0–8.3)

## 2014-09-19 LAB — URINALYSIS, ROUTINE W REFLEX MICROSCOPIC
BILIRUBIN URINE: NEGATIVE
HGB URINE DIPSTICK: NEGATIVE
KETONES UR: NEGATIVE
LEUKOCYTES UA: NEGATIVE
NITRITE: NEGATIVE
Specific Gravity, Urine: <=1.005 — AB (ref 1.000–1.030)
Total Protein, Urine: NEGATIVE
UROBILINOGEN UA: 0.2 (ref 0.0–1.0)
Urine Glucose: NEGATIVE
WBC UA: NONE SEEN (ref 0–?)
pH: 6.5 (ref 5.0–8.0)

## 2014-09-19 LAB — TSH: TSH: 0.71 u[IU]/mL (ref 0.35–4.50)

## 2014-09-19 LAB — HEMOGLOBIN A1C: Hgb A1c MFr Bld: 5.1 % (ref 4.6–6.5)

## 2014-09-19 NOTE — Assessment & Plan Note (Signed)
Pacemaker in place. Followed by Cardiology. Encouraged patient to follow-up with specialist as scheduled.

## 2014-09-19 NOTE — Assessment & Plan Note (Signed)
Stable. No manic episodes. Depression predominant. Denies SI/HI. Is followed by Psychiatry. Continue current regimen.

## 2014-09-19 NOTE — Progress Notes (Signed)
Pre visit review using our clinic review tool, if applicable. No additional management support is needed unless otherwise documented below in the visit note/SLS  

## 2014-09-19 NOTE — Progress Notes (Signed)
Patient presents to clinic today to establish care. Is needing a CPE. Is fasting for labs.  Chronic Issues: HX of SVT -- Followed by Cardiology (Dr. Rica Mote [facility]). Pacemaker in place. Patient denies chest pain, palpitations, lightheadedness, dizziness, vision changes or frequent headaches.  Bipolar Disorder I, Depression predominant -- Followed by Psychiatry at [facility]. Is currently on combination of Lamictal and Fluoxetine with good relief of symptoms. Denies SI/HI.  ADD -- Followed by Psychiatry. Endorses doing well with Adderall 20 mg BID. Denies side effects of medication.  Health Maintenance: Immunizations -- Tetanus in 2010 per patient. Flu shot will be given today.   Past Medical History  Diagnosis Date  . SVT (supraventricular tachycardia)     Pacemaker  . Vaso vagal episode   . History of chicken pox   . Acid reflux   . Sciatica of left side   . Bulging lumbar disc     L5    Past Surgical History  Procedure Laterality Date  . Pacemaker insertion  2014  . Wisdom tooth extraction    . Spinal injections      Current Outpatient Prescriptions on File Prior to Visit  Medication Sig Dispense Refill  . ibuprofen (ADVIL,MOTRIN) 200 MG tablet Take 400 mg by mouth every 6 (six) hours as needed for headache, mild pain or moderate pain.      No current facility-administered medications on file prior to visit.    No Known Allergies  Family History  Problem Relation Age of Onset  . Hypertension Mother     Living  . Other Mother     Hyperglycemia  . Cancer Father     Living - Leukemia  . Heart attack Father   . Heart disease Father   . Cancer Maternal Grandfather     Leukemia  . Cancer Paternal Grandmother     Oral Cancer  . Lung cancer Paternal Uncle     x4  . Thyroid disease Sister     #1  . Gallstones Sister     #2  . Allergies Son   . Healthy Daughter     x1    Social History   Social History  . Marital Status: Single    Spouse  Name: N/A  . Number of Children: 2  . Years of Education: N/A   Occupational History  . 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 Smoking status: Former Smoker    Types: Cigarettes  . Smokeless tobacco: Never Used     Comment: QUIT 6 YEARS AGO  . Alcohol Use: No  . Drug Use: No  . Sexual Activity:    Partners: Female   Other Topics Concern  . Not on file   Social History Narrative    Review of Systems  Constitutional: Negative for fever and weight loss.  HENT: Negative for ear discharge, ear pain, hearing loss and tinnitus.   Eyes: Negative for blurred vision, double vision, photophobia and pain.  Respiratory: Negative for cough and shortness of breath.   Cardiovascular: Negative for chest pain and palpitations.  Gastrointestinal: Negative for heartburn, nausea, vomiting, abdominal pain, diarrhea, constipation, blood in stool and melena.  Genitourinary: Negative for dysuria, urgency, frequency, hematuria and flank pain.  Musculoskeletal: Negative for falls.  Neurological: Negative for dizziness, loss of consciousness and headaches.  Endo/Heme/Allergies: Negative for environmental allergies.  Psychiatric/Behavioral: Negative for depression, suicidal ideas, hallucinations and substance abuse. The patient is not  nervous/anxious and does not have insomnia.     BP 124/82 mmHg  Pulse 97  Temp(Src) 98.3 F (36.8 C) (Oral)  Resp 16  Ht 5' 10.5" (1.791 m)  Wt 198 lb (89.812 kg)  BMI 28.00 kg/m2  SpO2 98%  Physical Exam  Constitutional: He is oriented to person, place, and time and well-developed, well-nourished, and in no distress.  HENT:  Head: Normocephalic and atraumatic.  Right Ear: External ear normal.  Left Ear: External ear normal.  Nose: Nose normal.  Mouth/Throat: Oropharynx is clear and moist. No oropharyngeal exudate.  Eyes: Conjunctivae and EOM are normal. Pupils are equal, round, and reactive to light.  Neck: Neck supple. No  thyromegaly present.  Cardiovascular: Normal rate, regular rhythm, normal heart sounds and intact distal pulses.   Pulmonary/Chest: Effort normal and breath sounds normal. No respiratory distress. He has no wheezes. He has no rales. He exhibits no tenderness.  Abdominal: Soft. Bowel sounds are normal. He exhibits no distension and no mass. There is no tenderness. There is no rebound and no guarding.  Genitourinary: Testes/scrotum normal.  Deferred.  Lymphadenopathy:    He has no cervical adenopathy.  Neurological: He is alert and oriented to person, place, and time.  Skin: Skin is warm and dry. No rash noted.  Psychiatric: Affect normal.  Vitals reviewed.   No results found for this or any previous visit (from the past 2160 hour(s)).  Assessment/Plan: Visit for preventive health examination Depression screen performed. Health Maintenance reviewed -- Flu shot given today. Preventive schedule discussed and handout given in AVS. Will obtain fasting labs today.   Supraventricular tachycardia Pacemaker in place. Followed by Cardiology. Encouraged patient to follow-up with specialist as scheduled.  Bipolar disorder with depression Stable. No manic episodes. Depression predominant. Denies SI/HI. Is followed by Psychiatry. Continue current regimen.       By

## 2014-09-19 NOTE — Assessment & Plan Note (Signed)
Depression screen performed. Health Maintenance reviewed -- Flu shot given today. Preventive schedule discussed and handout given in AVS. Will obtain fasting labs today.

## 2014-09-19 NOTE — Patient Instructions (Signed)
Please continue your medications as directed. Follow-up with your specialists as scheduled.  Please go to the lab for blood work.  I will call you with your results. If your blood work is normal we will follow-up yearly for physicals.  If anything is abnormal we will treat accordingly and get you in for follow-up.  Preventive Care for Adults A healthy lifestyle and preventive care can promote health and wellness. Preventive health guidelines for men include the following key practices:  A routine yearly physical is a good way to check with your health care provider about your health and preventative screening. It is a chance to share any concerns and updates on your health and to receive a thorough exam.  Visit your dentist for a routine exam and preventative care every 6 months. Brush your teeth twice a day and floss once a day. Good oral hygiene prevents tooth decay and gum disease.  The frequency of eye exams is based on your age, health, family medical history, use of contact lenses, and other factors. Follow your health care provider's recommendations for frequency of eye exams.  Eat a healthy diet. Foods such as vegetables, fruits, whole grains, low-fat dairy products, and lean protein foods contain the nutrients you need without too many calories. Decrease your intake of foods high in solid fats, added sugars, and salt. Eat the right amount of calories for you.Get information about a proper diet from your health care provider, if necessary.  Regular physical exercise is one of the most important things you can do for your health. Most adults should get at least 150 minutes of moderate-intensity exercise (any activity that increases your heart rate and causes you to sweat) each week. In addition, most adults need muscle-strengthening exercises on 2 or more days a week.  Maintain a healthy weight. The body mass index (BMI) is a screening tool to identify possible weight problems. It  provides an estimate of body fat based on height and weight. Your health care provider can find your BMI and can help you achieve or maintain a healthy weight.For adults 20 years and older:  A BMI below 18.5 is considered underweight.  A BMI of 18.5 to 24.9 is normal.  A BMI of 25 to 29.9 is considered overweight.  A BMI of 30 and above is considered obese.  Maintain normal blood lipids and cholesterol levels by exercising and minimizing your intake of saturated fat. Eat a balanced diet with plenty of fruit and vegetables. Blood tests for lipids and cholesterol should begin at age 59 and be repeated every 5 years. If your lipid or cholesterol levels are high, you are over 50, or you are at high risk for heart disease, you may need your cholesterol levels checked more frequently.Ongoing high lipid and cholesterol levels should be treated with medicines if diet and exercise are not working.  If you smoke, find out from your health care provider how to quit. If you do not use tobacco, do not start.  Lung cancer screening is recommended for adults aged 79-80 years who are at high risk for developing lung cancer because of a history of smoking. A yearly low-dose CT scan of the lungs is recommended for people who have at least a 30-pack-year history of smoking and are a current smoker or have quit within the past 15 years. A pack year of smoking is smoking an average of 1 pack of cigarettes a day for 1 year (for example: 1 pack a day for 30  years or 2 packs a day for 15 years). Yearly screening should continue until the smoker has stopped smoking for at least 15 years. Yearly screening should be stopped for people who develop a health problem that would prevent them from having lung cancer treatment.  If you choose to drink alcohol, do not have more than 2 drinks per day. One drink is considered to be 12 ounces (355 mL) of beer, 5 ounces (148 mL) of wine, or 1.5 ounces (44 mL) of liquor.  Avoid use of  street drugs. Do not share needles with anyone. Ask for help if you need support or instructions about stopping the use of drugs.  High blood pressure causes heart disease and increases the risk of stroke. Your blood pressure should be checked at least every 1-2 years. Ongoing high blood pressure should be treated with medicines, if weight loss and exercise are not effective.  If you are 82-21 years old, ask your health care provider if you should take aspirin to prevent heart disease.  Diabetes screening involves taking a blood sample to check your fasting blood sugar level. This should be done once every 3 years, after age 44, if you are within normal weight and without risk factors for diabetes. Testing should be considered at a younger age or be carried out more frequently if you are overweight and have at least 1 risk factor for diabetes.  Colorectal cancer can be detected and often prevented. Most routine colorectal cancer screening begins at the age of 73 and continues through age 57. However, your health care provider may recommend screening at an earlier age if you have risk factors for colon cancer. On a yearly basis, your health care provider may provide home test kits to check for hidden blood in the stool. Use of a small camera at the end of a tube to directly examine the colon (sigmoidoscopy or colonoscopy) can detect the earliest forms of colorectal cancer. Talk to your health care provider about this at age 72, when routine screening begins. Direct exam of the colon should be repeated every 5-10 years through age 23, unless early forms of precancerous polyps or small growths are found.  People who are at an increased risk for hepatitis B should be screened for this virus. You are considered at high risk for hepatitis B if:  You were born in a country where hepatitis B occurs often. Talk with your health care provider about which countries are considered high risk.  Your parents were  born in a high-risk country and you have not received a shot to protect against hepatitis B (hepatitis B vaccine).  You have HIV or AIDS.  You use needles to inject street drugs.  You live with, or have sex with, someone who has hepatitis B.  You are a man who has sex with other men (MSM).  You get hemodialysis treatment.  You take certain medicines for conditions such as cancer, organ transplantation, and autoimmune conditions.  Hepatitis C blood testing is recommended for all people born from [birth year] through 1965 and any individual with known risks for hepatitis C.  Practice safe sex. Use condoms and avoid high-risk sexual practices to reduce the spread of sexually transmitted infections (STIs). STIs include gonorrhea, chlamydia, syphilis, trichomonas, herpes, HPV, and human immunodeficiency virus (HIV). Herpes, HIV, and HPV are viral illnesses that have no cure. They can result in disability, cancer, and death.  If you are at risk of being infected with HIV, it is recommended  that you take a prescription medicine daily to prevent HIV infection. This is called preexposure prophylaxis (PrEP). You are considered at risk if:  You are a man who has sex with other men (MSM) and have other risk factors.  You are a heterosexual man, are sexually active, and are at increased risk for HIV infection.  You take drugs by injection.  You are sexually active with a partner who has HIV.  Talk with your health care provider about whether you are at high risk of being infected with HIV. If you choose to begin PrEP, you should first be tested for HIV. You should then be tested every 3 months for as long as you are taking PrEP.  A one-time screening for abdominal aortic aneurysm (AAA) and surgical repair of large AAAs by ultrasound are recommended for men ages 54 to 45 years who are current or former smokers.  Healthy men should no longer receive prostate-specific antigen (PSA) blood tests as part of  routine cancer screening. Talk with your health care provider about prostate cancer screening.  Testicular cancer screening is not recommended for adult males who have no symptoms. Screening includes self-exam, a health care provider exam, and other screening tests. Consult with your health care provider about any symptoms you have or any concerns you have about testicular cancer.  Use sunscreen. Apply sunscreen liberally and repeatedly throughout the day. You should seek shade when your shadow is shorter than you. Protect yourself by wearing long sleeves, pants, a wide-brimmed hat, and sunglasses year round, whenever you are outdoors.  Once a month, do a whole-body skin exam, using a mirror to look at the skin on your back. Tell your health care provider about new moles, moles that have irregular borders, moles that are larger than a pencil eraser, or moles that have changed in shape or color.  Stay current with required vaccines (immunizations).  Influenza vaccine. All adults should be immunized every year.  Tetanus, diphtheria, and acellular pertussis (Td, Tdap) vaccine. An adult who has not previously received Tdap or who does not know his vaccine status should receive 1 dose of Tdap. This initial dose should be followed by tetanus and diphtheria toxoids (Td) booster doses every 10 years. Adults with an unknown or incomplete history of completing a 3-dose immunization series with Td-containing vaccines should begin or complete a primary immunization series including a Tdap dose. Adults should receive a Td booster every 10 years.  Varicella vaccine. An adult without evidence of immunity to varicella should receive 2 doses or a second dose if he has previously received 1 dose.  Human papillomavirus (HPV) vaccine. Males aged 54-21 years who have not received the vaccine previously should receive the 3-dose series. Males aged 22-26 years may be immunized. Immunization is recommended through the age  of 22 years for any male who has sex with males and did not get any or all doses earlier. Immunization is recommended for any person with an immunocompromised condition through the age of 24 years if he did not get any or all doses earlier. During the 3-dose series, the second dose should be obtained 4-8 weeks after the first dose. The third dose should be obtained 24 weeks after the first dose and 16 weeks after the second dose.  Zoster vaccine. One dose is recommended for adults aged 45 years or older unless certain conditions are present.  Measles, mumps, and rubella (MMR) vaccine. Adults born before [birth year] generally are considered immune to measles and mumps.  Adults born in [birth year] or later should have 1 or more doses of MMR vaccine unless there is a contraindication to the vaccine or there is laboratory evidence of immunity to each of the three diseases. A routine second dose of MMR vaccine should be obtained at least 28 days after the first dose for students attending postsecondary schools, health care workers, or international travelers. People who received inactivated measles vaccine or an unknown type of measles vaccine during 1963-1967 should receive 2 doses of MMR vaccine. People who received inactivated mumps vaccine or an unknown type of mumps vaccine before 1979 and are at high risk for mumps infection should consider immunization with 2 doses of MMR vaccine. Unvaccinated health care workers born before [birth year] who lack laboratory evidence of measles, mumps, or rubella immunity or laboratory confirmation of disease should consider measles and mumps immunization with 2 doses of MMR vaccine or rubella immunization with 1 dose of MMR vaccine.  Pneumococcal 13-valent conjugate (PCV13) vaccine. When indicated, a person who is uncertain of his immunization history and has no record of immunization should receive the PCV13 vaccine. An adult aged 47 years or older who has certain medical conditions and has not  been previously immunized should receive 1 dose of PCV13 vaccine. This PCV13 should be followed with a dose of pneumococcal polysaccharide (PPSV23) vaccine. The PPSV23 vaccine dose should be obtained at least 8 weeks after the dose of PCV13 vaccine. An adult aged 60 years or older who has certain medical conditions and previously received 1 or more doses of PPSV23 vaccine should receive 1 dose of PCV13. The PCV13 vaccine dose should be obtained 1 or more years after the last PPSV23 vaccine dose.  Pneumococcal polysaccharide (PPSV23) vaccine. When PCV13 is also indicated, PCV13 should be obtained first. All adults aged 69 years and older should be immunized. An adult younger than age 59 years who has certain medical conditions should be immunized. Any person who resides in a nursing home or long-term care facility should be immunized. An adult smoker should be immunized. People with an immunocompromised condition and certain other conditions should receive both PCV13 and PPSV23 vaccines. People with human immunodeficiency virus (HIV) infection should be immunized as soon as possible after diagnosis. Immunization during chemotherapy or radiation therapy should be avoided. Routine use of PPSV23 vaccine is not recommended for American Indians, [place] Natives, or people younger than 65 years unless there are medical conditions that require PPSV23 vaccine. When indicated, people who have unknown immunization and have no record of immunization should receive PPSV23 vaccine. One-time revaccination 5 years after the first dose of PPSV23 is recommended for people aged 19-64 years who have chronic kidney failure, nephrotic syndrome, asplenia, or immunocompromised conditions. People who received 1-2 doses of PPSV23 before age 67 years should receive another dose of PPSV23 vaccine at age 45 years or later if at least 5 years have passed since the previous dose. Doses of PPSV23 are not needed for people immunized with PPSV23 at  or after age 64 years.  Meningococcal vaccine. Adults with asplenia or persistent complement component deficiencies should receive 2 doses of quadrivalent meningococcal conjugate (MenACWY-D) vaccine. The doses should be obtained at least 2 months apart. Microbiologists working with certain meningococcal bacteria, [employer], people at risk during an outbreak, and people who travel to or live in countries with a high rate of meningitis should be immunized. A first-year college student up through age 88 years who is living in a residence hall should receive  a dose if he did not receive a dose on or after his 16th birthday. Adults who have certain high-risk conditions should receive one or more doses of vaccine.  Hepatitis A vaccine. Adults who wish to be protected from this disease, have certain high-risk conditions, work with hepatitis A-infected animals, work in hepatitis A research labs, or travel to or work in countries with a high rate of hepatitis A should be immunized. Adults who were previously unvaccinated and who anticipate close contact with an international adoptee during the first 60 days after arrival in the [place] from a country with a high rate of hepatitis A should be immunized.  Hepatitis B vaccine. Adults should be immunized if they wish to be protected from this disease, have certain high-risk conditions, may be exposed to blood or other infectious body fluids, are household contacts or sex partners of hepatitis B positive people, are clients or workers in certain care facilities, or travel to or work in countries with a high rate of hepatitis B.  Haemophilus influenzae type b (Hib) vaccine. A previously unvaccinated person with asplenia or sickle cell disease or having a scheduled splenectomy should receive 1 dose of Hib vaccine. Regardless of previous immunization, a recipient of a hematopoietic stem cell transplant should receive a 3-dose series 6-12 months after his  successful transplant. Hib vaccine is not recommended for adults with HIV infection. Preventive Service / Frequency Ages 86 to 46  Blood pressure check.** / Every 1 to 2 years.  Lipid and cholesterol check.** / Every 5 years beginning at age 28.  Hepatitis C blood test.** / For any individual with known risks for hepatitis C.  Skin self-exam. / Monthly.  Influenza vaccine. / Every year.  Tetanus, diphtheria, and acellular pertussis (Tdap, Td) vaccine.** / Consult your health care provider. 1 dose of Td every 10 years.  Varicella vaccine.** / Consult your health care provider.  HPV vaccine. / 3 doses over 6 months, if 66 or younger.  Measles, mumps, rubella (MMR) vaccine.** / You need at least 1 dose of MMR if you were born in 1957 or later. You may also need a second dose.  Pneumococcal 13-valent conjugate (PCV13) vaccine.** / Consult your health care provider.  Pneumococcal polysaccharide (PPSV23) vaccine.** / 1 to 2 doses if you smoke cigarettes or if you have certain conditions.  Meningococcal vaccine.** / 1 dose if you are age 58 to 47 years and a Market researcher living in a residence hall, or have one of several medical conditions. You may also need additional booster doses.  Hepatitis A vaccine.** / Consult your health care provider.  Hepatitis B vaccine.** / Consult your health care provider.  Haemophilus influenzae type b (Hib) vaccine.** / Consult your health care provider. Ages 32 to 75  Blood pressure check.** / Every 1 to 2 years.  Lipid and cholesterol check.** / Every 5 years beginning at age 87.  Lung cancer screening. / Every year if you are aged 25-80 years and have a 30-pack-year history of smoking and currently smoke or have quit within the past 15 years. Yearly screening is stopped once you have quit smoking for at least 15 years or develop a health problem that would prevent you from having lung cancer treatment.  Fecal occult blood test (FOBT)  of stool. / Every year beginning at age 71 and continuing until age 61. You may not have to do this test if you get a colonoscopy every 10 years.  Flexible sigmoidoscopy** or  colonoscopy.** / Every 5 years for a flexible sigmoidoscopy or every 10 years for a colonoscopy beginning at age 64 and continuing until age 11.  Hepatitis C blood test.** / For all people born from [birth year] through 1966 and any individual with known risks for hepatitis C.  Skin self-exam. / Monthly.  Influenza vaccine. / Every year.  Tetanus, diphtheria, and acellular pertussis (Tdap/Td) vaccine.** / Consult your health care provider. 1 dose of Td every 10 years.  Varicella vaccine.** / Consult your health care provider.  Zoster vaccine.** / 1 dose for adults aged 98 years or older.  Measles, mumps, rubella (MMR) vaccine.** / You need at least 1 dose of MMR if you were born in 1958 or later. You may also need a second dose.  Pneumococcal 13-valent conjugate (PCV13) vaccine.** / Consult your health care provider.  Pneumococcal polysaccharide (PPSV23) vaccine.** / 1 to 2 doses if you smoke cigarettes or if you have certain conditions.  Meningococcal vaccine.** / Consult your health care provider.  Hepatitis A vaccine.** / Consult your health care provider.  Hepatitis B vaccine.** / Consult your health care provider.  Haemophilus influenzae type b (Hib) vaccine.** / Consult your health care provider. Ages 26 and over  Blood pressure check.** / Every 1 to 2 years.  Lipid and cholesterol check.**/ Every 5 years beginning at age 48.  Lung cancer screening. / Every year if you are aged 53-80 years and have a 30-pack-year history of smoking and currently smoke or have quit within the past 15 years. Yearly screening is stopped once you have quit smoking for at least 15 years or develop a health problem that would prevent you from having lung cancer treatment.  Fecal occult blood test (FOBT) of stool. / Every year  beginning at age 1 and continuing until age 30. You may not have to do this test if you get a colonoscopy every 10 years.  Flexible sigmoidoscopy** or colonoscopy.** / Every 5 years for a flexible sigmoidoscopy or every 10 years for a colonoscopy beginning at age 32 and continuing until age 17.  Hepatitis C blood test.** / For all people born from [birth year] through 1966 and any individual with known risks for hepatitis C.  Abdominal aortic aneurysm (AAA) screening.** / A one-time screening for ages 69 to 2 years who are current or former smokers.  Skin self-exam. / Monthly.  Influenza vaccine. / Every year.  Tetanus, diphtheria, and acellular pertussis (Tdap/Td) vaccine.** / 1 dose of Td every 10 years.  Varicella vaccine.** / Consult your health care provider.  Zoster vaccine.** / 1 dose for adults aged 12 years or older.  Pneumococcal 13-valent conjugate (PCV13) vaccine.** / Consult your health care provider.  Pneumococcal polysaccharide (PPSV23) vaccine.** / 1 dose for all adults aged 76 years and older.  Meningococcal vaccine.** / Consult your health care provider.  Hepatitis A vaccine.** / Consult your health care provider.  Hepatitis B vaccine.** / Consult your health care provider.  Haemophilus influenzae type b (Hib) vaccine.** / Consult your health care provider. **Family history and personal history of risk and conditions may change your health care provider's recommendations. Document Released: 02/15/2001 Document Revised: 12/25/2012 Document Reviewed: 05/17/2010 [facility] Patient Information 2015 [place], [place]. This information is not intended to replace advice given to you by your health care provider. Make sure you discuss any questions you have with your health care provider.

## 2014-09-23 ENCOUNTER — Encounter: Payer: Self-pay | Admitting: *Deleted

## 2015-01-23 ENCOUNTER — Ambulatory Visit: Payer: Managed Care, Other (non HMO) | Admitting: Physician Assistant

## 2015-03-17 ENCOUNTER — Encounter (HOSPITAL_COMMUNITY): Payer: Self-pay | Admitting: Emergency Medicine

## 2015-03-17 ENCOUNTER — Encounter: Payer: Self-pay | Admitting: Physician Assistant

## 2015-03-17 ENCOUNTER — Emergency Department (HOSPITAL_COMMUNITY)
Admission: EM | Admit: 2015-03-17 | Discharge: 2015-03-18 | Disposition: A | Discharge status: Home / Self Care | Payer: Managed Care, Other (non HMO) | Attending: Emergency Medicine | Admitting: Emergency Medicine

## 2015-03-17 DIAGNOSIS — Z8679 Personal history of other diseases of the circulatory system: Secondary | ICD-10-CM | POA: Diagnosis not present

## 2015-03-17 DIAGNOSIS — Z8739 Personal history of other diseases of the musculoskeletal system and connective tissue: Secondary | ICD-10-CM | POA: Diagnosis not present

## 2015-03-17 DIAGNOSIS — R45851 Suicidal ideations: Secondary | ICD-10-CM

## 2015-03-17 DIAGNOSIS — Z8619 Personal history of other infectious and parasitic diseases: Secondary | ICD-10-CM | POA: Insufficient documentation

## 2015-03-17 DIAGNOSIS — Z950 Presence of cardiac pacemaker: Secondary | ICD-10-CM | POA: Insufficient documentation

## 2015-03-17 DIAGNOSIS — Z79899 Other long term (current) drug therapy: Secondary | ICD-10-CM | POA: Diagnosis not present

## 2015-03-17 DIAGNOSIS — Z87891 Personal history of nicotine dependence: Secondary | ICD-10-CM | POA: Diagnosis not present

## 2015-03-17 DIAGNOSIS — F319 Bipolar disorder, unspecified: Secondary | ICD-10-CM | POA: Diagnosis not present

## 2015-03-17 DIAGNOSIS — Z8719 Personal history of other diseases of the digestive system: Secondary | ICD-10-CM | POA: Diagnosis not present

## 2015-03-17 LAB — COMPREHENSIVE METABOLIC PANEL
ALBUMIN: 4.4 g/dL (ref 3.5–5.0)
ALK PHOS: 69 U/L (ref 38–126)
ALT: 50 U/L (ref 17–63)
ANION GAP: 11 (ref 5–15)
AST: 58 U/L — ABNORMAL HIGH (ref 15–41)
BILIRUBIN TOTAL: 0.6 mg/dL (ref 0.3–1.2)
BUN: 16 mg/dL (ref 6–20)
CALCIUM: 9.6 mg/dL (ref 8.9–10.3)
CO2: 23 mmol/L (ref 22–32)
Chloride: 107 mmol/L (ref 101–111)
Creatinine, Ser: 1.02 mg/dL (ref 0.61–1.24)
GFR calc Af Amer: >60 mL/min (ref >60)
GLUCOSE: 111 mg/dL — AB (ref 65–99)
Potassium: 3.6 mmol/L (ref 3.5–5.1)
Sodium: 141 mmol/L (ref 135–145)
TOTAL PROTEIN: 7.4 g/dL (ref 6.5–8.1)

## 2015-03-17 LAB — CBC
HEMATOCRIT: 41.9 % (ref 39.0–52.0)
Hemoglobin: 14.5 g/dL (ref 13.0–17.0)
MCH: 30.3 pg (ref 26.0–34.0)
MCHC: 34.6 g/dL (ref 30.0–36.0)
MCV: 87.7 fL (ref 78.0–100.0)
PLATELETS: 265 10*3/uL (ref 150–400)
RBC: 4.78 MIL/uL (ref 4.22–5.81)
RDW: 13.2 % (ref 11.5–15.5)
WBC: 10.5 10*3/uL (ref 4.0–10.5)

## 2015-03-17 LAB — SALICYLATE LEVEL: Salicylate Lvl: <4 mg/dL (ref 2.8–30.0)

## 2015-03-17 LAB — ETHANOL

## 2015-03-17 LAB — ACETAMINOPHEN LEVEL

## 2015-03-17 MED ORDER — ALUM & MAG HYDROXIDE-SIMETH 200-200-20 MG/5ML PO SUSP: 30.0000 mL | ORAL | Status: DC | PRN

## 2015-03-17 MED ORDER — ONDANSETRON HCL 4 MG PO TABS: 4.0000 mg | ORAL_TABLET | Freq: Three times a day (TID) | ORAL | Status: DC | PRN

## 2015-03-17 MED ORDER — NICOTINE 21 MG/24HR TD PT24: 21.0000 mg | MEDICATED_PATCH | Freq: Every day | TRANSDERMAL | Status: DC | PRN

## 2015-03-17 MED ORDER — LORAZEPAM 1 MG PO TABS: 1.0000 mg | ORAL_TABLET | Freq: Three times a day (TID) | ORAL | Status: DC | PRN

## 2015-03-17 MED ORDER — ACETAMINOPHEN 325 MG PO TABS: 650.0000 mg | ORAL_TABLET | ORAL | Status: DC | PRN

## 2015-03-17 MED ORDER — IBUPROFEN 200 MG PO TABS: 600.0000 mg | ORAL_TABLET | Freq: Three times a day (TID) | ORAL | Status: DC | PRN

## 2015-03-17 NOTE — ED Notes (Signed)
Pt reports he argued with girlfriend and made passive statements about wanting to kill himself.  No plan noted.  Denies feeling hopeless.  Pt reports he has missed his meds for the past few days.  Drinks occasional alcohol, denies drug use. AAO x 3, no distress noted, calm & cooperative.  Monitoring for safety, Q 15 min checks in effect.

## 2015-03-17 NOTE — BH Assessment (Addendum)
Tele Assessment Note   Keith Underwood is a caucasian, engaged, employed 35 y.o. male [facility] to suicidal statements he made to his girlfriend during an argument earlier this evening. Pt states that he got into a disagreement with his girlfriend tonight and said "I should fucking kill myself" on two occassions throughout the argument. Pt's girlfriend called the police and pt came voluntarily to the ED for evaluation. The pt states that he said the remarks out of anger and "to try and scare" his girlfriend. He states that he did not mean it and does not have a desire to harm or kill himself. He says that he's made these types of statements when angry before, but he has never acted on them. The pt admits to passive SI in the past but claims he has not thought about it in several months. The pt believes that the trigger was going without his medications over the weekend. He says he went to a bachelor party and was drinking, so he did not take his psychiatric medications. He says that he would like to problem-solve with his therapist so that he has a plan for what to do when these types of situations arise in the future (e.g. runs out of meds, forgets to refill, goes on vacation and forgets to pack them, etc). The pt denies any hx of suicide attempt or gesture. He denies SI/HI, self-harming behaviors, A/VH, or SA. He reports no access to weapons and says he typically takes his psychiatric medications daily as prescribed. He endorses some sx of depression, such as fatigue and irritability, but based on his responses, he appears to be at baseline. The pt receives services from the [facility] and seems invested in his recovery and treatment. 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 Pt reports a hx of alcohol abuse about 10 years ago, but he says he got treatment and started going to [employer] meetings and no longer abuses alcohol. He endorses drinking socially about  once a month. He reports his fiancee/girlfriend as being supportive. Pt wants to be d/c to follow up with his outpatient therapist. He states that he can contract for safety.  Disposition: Per Donell SievertSpencer Simon, PA-C Pt does not meet inpt criteria. Recommended D/C with signing of no-harm contract and follow-up with his current outpatient provider later today.  Diagnosis: 311 Unspecified depressive disorder  Past Medical History:  Past Medical History  Diagnosis Date  . SVT (supraventricular tachycardia) (HCC)     Pacemaker  . Vaso vagal episode   . History of chicken pox   . Acid reflux   . Sciatica of left side   . Bulging lumbar disc     L5    Past Surgical History  Procedure Laterality Date  . Pacemaker insertion  2014  . Wisdom tooth extraction    . Spinal injections      Family History:  Family History  Problem Relation Age of Onset  . Hypertension Mother     Living  . Other Mother     Hyperglycemia  . Cancer Father     Living - Leukemia  . Heart attack Father   . Heart disease Father   . Cancer Maternal Grandfather     Leukemia  . Cancer Paternal Grandmother     Oral Cancer  . Lung cancer Paternal Uncle     x4  . Thyroid disease Sister     #1  .  Gallstones Sister     #2  . Allergies Son   . Healthy Daughter     x1    Social History:  reports that he has quit smoking. His smoking use included Cigarettes. He has never used smokeless tobacco. He reports that he does not drink alcohol or use illicit drugs.  Additional Social History:  Alcohol / Drug Use Pain Medications: See PTA med list Prescriptions: See PTA med list Over the Counter: See PTA med list History of alcohol / drug use?: Yes Longest period of sobriety (when/how long): Pt says he has not abused alcohol in the past 9-10 yrs Substance #1 Name of Substance 1: Etoh 1 - Age of First Use: teens 1 - Amount (size/oz): "A lot in the past, not even sure of the amount" 1 - Frequency: Daily 1 -  Duration: A couple of years from age [age] 1 - Last Use / Amount: Past weekend ("a 6-pk"); Pt says he only drinks socially 1x per month now Substance #2 Name of Substance 2: THC 2 - Age of First Use: teens 2 - Amount (size/oz): varied 2 - Frequency: varied 2 - Duration: "A few years" 2 - Last Use / Amount: "2 years ago maybe"  CIWA: CIWA-Ar BP: 108/69 mmHg Pulse Rate: 79 COWS:    PATIENT STRENGTHS: (choose at least two) Ability for insight Active sense of humor Average or above average intelligence Capable of independent living Metallurgist fund of knowledge Motivation for treatment/growth Physical Health Religious Affiliation Special hobby/interest Supportive family/friends Work skills  Allergies: No Known Allergies  Home Medications:  (Not in a hospital admission)  OB/GYN Status:  No LMP for male patient.  General Assessment Data Location of Assessment: [facility] ED TTS Assessment: In system Is this a Tele or Face-to-Face Assessment?: Face-to-Face Is this an Initial Assessment or a Re-assessment for this encounter?: Initial Assessment Marital status: Long term relationship Maiden name: n/a Is patient pregnant?: No Pregnancy Status: No Living Arrangements: Spouse/significant other Can pt return to current living arrangement?: Yes Admission Status: Voluntary Is patient capable of signing voluntary admission?: Yes Referral Source: Self/Family/Friend Insurance type: Designer, industrial/product Exam [facility] Walk-in ONLY) Medical Exam completed: Yes  Crisis Care Plan Living Arrangements: Spouse/significant other Legal Guardian:  (n/a) Name of Psychiatrist: [facility] Name of Therapist: [facility]  Education Status Is patient currently in school?: Yes 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 : No Suicidal Intent: No Has patient had any suicidal intent within the past 6 months prior to admission? : No Is patient at risk for suicide?: No Suicidal Plan?: No Has patient had any suicidal plan within the past 6 months prior to admission? : No Access to Means: No What has been your use of drugs/alcohol within the last 12 months?: Alcohol use 1x per month Previous Attempts/Gestures: No How many times?: 0 Other Self Harm Risks: None known Triggers for Past Attempts: None known Intentional Self Injurious Behavior: None Family Suicide History: No Recent stressful life event(s): Other (Comment) Micah Flesher without my meds over the weekend") Persecutory voices/beliefs?: No Depression: Yes Depression Symptoms: Fatigue, Feeling angry/irritable Substance abuse history and/or treatment for substance abuse?: Yes Suicide prevention information given to non-admitted patients: Not applicable  Risk  to Others within the past 6 months Homicidal Ideation: No Does patient have any lifetime risk of violence toward others beyond the six months prior to admission? : No Thoughts of Harm to Others: No Current Homicidal Intent: No Current Homicidal Plan: No Access to Homicidal Means: No Identified Victim: n/a History of harm to others?: No Assessment of Violence: None Noted Violent Behavior Description: No known hx of violent behavior Does patient have access to weapons?: No Criminal Charges Pending?: No Does patient have a court date: No Is patient on probation?: No  Psychosis Hallucinations: None noted Delusions: None noted  Mental Status Report Appearance/Hygiene: In scrubs Eye Contact: Good Motor Activity: Freedom of movement Speech: Logical/coherent Level of Consciousness: Alert Mood: Euthymic Affect: Other (Comment) (level) Anxiety Level: Minimal Thought Processes: Coherent,  Relevant Judgement: Partial Orientation: Person, Place, Time, Situation Obsessive Compulsive Thoughts/Behaviors: None  Cognitive Functioning Concentration: Normal Memory: Recent Intact IQ: Average Insight: Good Impulse Control: Fair Appetite: Good Weight Loss: 0 Weight Gain: 0 Sleep: No Change Total Hours of Sleep: 7 Vegetative Symptoms: None  ADLScreening [facility]) Patient's cognitive ability adequate to safely complete daily activities?: Yes Patient able to express need for assistance with ADLs?: Yes Independently performs ADLs?: Yes (appropriate for developmental age)  Prior Inpatient Therapy Prior Inpatient Therapy: No Prior Therapy Dates: na Prior Therapy Facilty/Provider(s): na Reason for Treatment: na  Prior Outpatient Therapy Prior Outpatient Therapy: Yes Prior Therapy Dates: Current Prior Therapy Facilty/Provider(s): [facility] Reason for Treatment: Med Management, Therapy Does patient have an ACCT team?: No Does patient have Intensive In-House Services?  : No Does patient have [facility] services? : No Does patient have P4CC services?: No  ADL Screening (condition at time of admission) Patient's cognitive ability adequate to safely complete daily activities?: Yes Is the patient deaf or have difficulty hearing?: No Does the patient have difficulty seeing, even when wearing glasses/contacts?: No Does the patient have difficulty concentrating, remembering, or making decisions?: No Patient able to express need for assistance with ADLs?: Yes Does the patient have difficulty dressing or bathing?: No Independently performs ADLs?: Yes (appropriate for developmental age) Does the patient have difficulty walking or climbing stairs?: No Weakness of Legs: None Weakness of Arms/Hands: None  Home Assistive Devices/Equipment Home Assistive Devices/Equipment: None    Abuse/Neglect Assessment (Assessment to be complete while patient is alone) Physical  Abuse: Denies Verbal Abuse: Denies Sexual Abuse: Denies Exploitation of patient/patient's resources: Denies Self-Neglect: Denies Values / Beliefs Cultural Requests During Hospitalization: None Spiritual Requests During Hospitalization: None   Advance Directives (For Healthcare) Does patient have an advance directive?: No Would patient like information on creating an advanced directive?: No - patient declined information    Additional Information 1:1 In Past 12 Months?: No CIRT Risk: No Elopement Risk: No Does patient have medical clearance?: Yes     Disposition: Per Donell Sievert, PA-C Pt does not meet inpt criteria. Recommended D/C with signing of no-harm contract and follow-up with his current outpatient provider later today. Disposition Initial Assessment Completed for this Encounter: Yes Disposition of Patient: Other dispositions (Recommend D/C to follow up with current outpatient providers) Other disposition(s): To current provider  Bennie Hind 03/18/2015 2:54 AM

## 2015-03-17 NOTE — ED Notes (Signed)
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

## 2015-03-17 NOTE — ED Provider Notes (Signed)
CSN: [id number]     Arrival date & time 03/17/15  1851 History   First MD Initiated Contact with Patient 03/17/15 2033     Chief Complaint  Patient presents with  . Suicidal     (Consider location/radiation/quality/duration/timing/severity/associated sxs/prior Treatment) HPI Comments: Patient with history of pacemaker, bipolar disorder on Prozac, Lamictal, Abilify -- presents with complaint of voicing suicidal ideation. Patient was in an argument with his fianc earlier today. Patient stated twice during the argument that he wanted to kill himself. Police were called and patient was brought to the emergency department. Patient states that he has been off of his bipolar medications this weekend and has been drinking alcohol while away at a bachelor party. Patient agreed to be seen voluntarily. Patient states that he did not really feel suicidal and he stated that he said this just to scare his fianc. He currently denies any medical complaints otherwise. No recent illnesses. No chest pain or shortness of breath. Onset of symptoms acute. Course is resolved. Nothing makes symptoms better or worse.  The history is provided by the patient.    Past Medical History  Diagnosis Date  . SVT (supraventricular tachycardia) (HCC)     Pacemaker  . Vaso vagal episode   . History of chicken pox   . Acid reflux   . Sciatica of left side   . Bulging lumbar disc     L5   Past Surgical History  Procedure Laterality Date  . Pacemaker insertion  2014  . Wisdom tooth extraction    . Spinal injections     Family History  Problem Relation Age of Onset  . Hypertension Mother     Living  . Other Mother     Hyperglycemia  . Cancer Father     Living - Leukemia  . Heart attack Father   . Heart disease Father   . Cancer Maternal Grandfather     Leukemia  . Cancer Paternal Grandmother     Oral Cancer  . Lung cancer Paternal Uncle     x4  . Thyroid disease Sister     #1  . Gallstones Sister     #2   . Allergies Son   . Healthy Daughter     x1   Social History  Substance Use Topics  . Smoking status: Former Smoker    Types: Cigarettes  . Smokeless tobacco: Never Used     Comment: QUIT 6 YEARS AGO  . Alcohol Use: No    Review of Systems  Constitutional: Negative for fever.  HENT: Negative for rhinorrhea and sore throat.   Eyes: Negative for redness.  Respiratory: Negative for cough.   Cardiovascular: Negative for chest pain.  Gastrointestinal: Negative for nausea, vomiting, abdominal pain and diarrhea.  Genitourinary: Negative for dysuria.  Musculoskeletal: Negative for myalgias.  Skin: Negative for rash.  Neurological: Negative for headaches.  Psychiatric/Behavioral: Positive for suicidal ideas.    Allergies  Review of patient's allergies indicates no known allergies.  Home Medications   Prior to Admission medications   Medication Sig Start Date End Date Taking? Authorizing Provider  amphetamine-dextroamphetamine (ADDERALL) 20 MG tablet Take 20 mg by mouth 2 (two) times daily.   Yes Historical Provider, MD  ARIPiprazole (ABILIFY) 5 MG tablet Take 5 mg by mouth daily.   Yes Historical Provider, MD  finasteride (PROSCAR) 5 MG tablet TAKE 1/2 TAB BY MOUTH DAILY 03/11/15  Yes Historical Provider, MD  FLUoxetine (PROZAC) 40 MG capsule Take 40 mg by  mouth daily.  03/06/15  Yes Historical Provider, MD  lamoTRIgine (LAMICTAL) 100 MG tablet Take 100 mg by mouth at bedtime.  03/11/15  Yes Historical Provider, MD  ibuprofen (ADVIL,MOTRIN) 200 MG tablet Take 400 mg by mouth every 6 (six) hours as needed for headache, mild pain or moderate pain.     Historical Provider, MD   BP 133/81 mmHg  Pulse 110  Temp(Src) 98 F (36.7 C) (Oral)  Resp 12  SpO2 99%   Physical Exam  Constitutional: He appears well-developed and well-nourished.  HENT:  Head: Normocephalic and atraumatic.  Eyes: Conjunctivae are normal. Right eye exhibits no discharge. Left eye exhibits no discharge.  Neck:  Normal range of motion. Neck supple.  Cardiovascular: Normal rate, regular rhythm and normal heart sounds.   Pulmonary/Chest: Effort normal and breath sounds normal.  Abdominal: Soft. There is no tenderness.  Neurological: He is alert.  Skin: Skin is warm and dry.  Psychiatric: He has a normal mood and affect. He expresses no homicidal and no suicidal ideation.  Nursing note and vitals reviewed.  ED Course  Procedures (including critical care time) Labs Review Labs Reviewed  COMPREHENSIVE METABOLIC PANEL - Abnormal; Notable for the following:    Glucose, Bld 111 (*)    AST 58 (*)    All other components within normal limits  ACETAMINOPHEN LEVEL - Abnormal; Notable for the following:    Acetaminophen (Tylenol), Serum <10 (*)    All other components within normal limits  ETHANOL  SALICYLATE LEVEL  CBC  URINE RAPID DRUG SCREEN, HOSP PERFORMED    Imaging Review No results found. I have personally reviewed and evaluated these images and lab results as part of my medical decision-making.   EKG Interpretation None       8:57 PM Patient seen and examined. Pt is medically cleared.    Vital signs reviewed and are as follows: BP 133/81 mmHg  Pulse 110  Temp(Src) 98 F (36.7 C) (Oral)  Resp 12  SpO2 99%  12:09 AM Pending dispo per TTS.   MDM   Final diagnoses:  Suicidal ideation   Dispo per TTS.  Renne CriglerJoshua Deshanta Lady, PA-C 03/18/15 0010  Cathren LaineKevin Steinl, MD 03/18/15 [id number]

## 2015-03-17 NOTE — ED Notes (Signed)
Patient has not taken medicine all weekend. Patient stated he felt depressed and told his girlfriend "You should fucking kill himself". Girlfriend called police and police brought him to the hospital voluntarily.

## 2015-03-17 NOTE — ED Notes (Signed)
Patient being wanded by security.

## 2015-03-18 NOTE — Discharge Instructions (Signed)
Suicidal Feelings: How to Help Yourself °Suicide is the taking of one's own life. If you feel as though life is getting too tough to handle and are thinking about suicide, get help right away. To get help: °· Call your local emergency services (911 in the [place] °· Call a suicide hotline to speak with a trained counselor who understands how you are feeling. The following is a list of suicide hotlines in the [place]. For a list of hotlines in [place], visit www.suicide.org/hotlines/international/[place]-suicide-hotlines.html. °¨  1-800-273-TALK (1-800-273-8255). °¨  1-800-SUICIDE (1-800-784-2433). °¨  1-888-628-9454. This is a hotline for Spanish speakers. °¨  1-800-799-4TTY (1-800-799-4889). This is a hotline for TTY users. °¨  1-866-4-U-TREVOR (1-866-488-7386). This is a hotline for lesbian, gay, bisexual, transgender, or questioning youth. °· Contact a crisis center or a local suicide prevention center. To find a crisis center or suicide prevention center: °¨ Call your local hospital, clinic, community service organization, mental health center, social service provider, or health department. Ask for assistance in connecting to a crisis center. °¨ Visit www.suicidepreventionlifeline.org/getinvolved/locator for a list of crisis centers in the [place], or visit www.suicideprevention.[place]/thinking-about-suicide/find-a-crisis-centre for a list of centers in [place]. °· Visit the following websites: °¨  National Suicide Prevention Lifeline: www.suicidepreventionlifeline.org °¨  Hopeline: www.hopeline.com °¨  American Foundation for Suicide Prevention: www.afsp.org °¨  The Trevor Project (for lesbian, gay, bisexual, transgender, or questioning youth): www.thetrevorproject.org °HOW CAN I HELP MYSELF FEEL BETTER? °· Promise yourself that you will not do anything drastic when you have suicidal feelings. Remember, there is hope. Many people have gotten through suicidal thoughts and feelings, and you will, too. You may  have gotten through them before, and this proves that you can get through them again. °· Let family, friends, teachers, or counselors know how you are feeling. Try not to isolate yourself from those who care about you. Remember, they will want to help you. Talk with someone every day, even if you do not feel sociable. Face-to-face conversation is best. °· Call a mental health professional and see one regularly. °· Visit your primary health care provider every year. °· Eat a well-balanced diet, and space your meals so you eat regularly. °· Get plenty of rest. °· Avoid alcohol and drugs, and remove them from your home. They will only make you feel worse. °· If you are thinking of taking a lot of medicine, give your medicine to someone who can give it to you one day at a time. If you are on antidepressants and are concerned you will overdose, let your health care provider know so he or she can give you safer medicines. Ask your mental health professional about the possible side effects of any medicines you are taking. °· Remove weapons, poisons, knives, and anything else that could harm you from your home. °· Try to stick to routines. Follow a schedule every day. Put self-care on your schedule. °· Make a list of realistic goals, and cross them off when you achieve them. Accomplishments give a sense of worth. °· Wait until you are feeling better before doing the things you find difficult or unpleasant. °· Exercise if you are able. You will feel better if you exercise for even a half hour each day. °· Go out in the sun or into nature. This will help you recover from depression faster. If you have a favorite place to walk, go there. °· Do the things that have always given you pleasure. Play your favorite music, read a good book, paint a picture, play your favorite instrument, or do anything   else that takes your mind off your depression if it is safe to do. °· Keep your living space well lit. °· When you are feeling well,  write yourself a letter about tips and support that you can read when you are not feeling well. °· Remember that life's difficulties can be sorted out with help. Conditions can be treated. You can work on thoughts and strategies that serve you well. °  °This information is not intended to replace advice given to you by your health care provider. Make sure you discuss any questions you have with your health care provider. °  °Document Released: 06/26/2002 Document Revised: 01/10/2014 Document Reviewed: 04/16/2013 °Elsevier Interactive Patient Education ©2016 Elsevier Inc. ° °

## 2015-03-18 NOTE — ED Notes (Signed)
Pt cleared for discharge home, states he will take a taxi home. Verbalized understanding of DC instructions and signed no harm contract.  AAO x 3, no distress noted.

## 2015-03-18 NOTE — B[facility] Counselor (Signed)
Psych disposition: Per Donell SievertSpencer Simon, PA-C Pt does not meet inpt criteria. Recommended D/C with signing of no-harm contract and follow-up with his current outpatient provider later today.  Counselor informed Marlon Peliffany Greene, PA-C of disposition due to Renne CriglerJoshua Geiple, PA-C leaving for the night.   Counselor witnessed pt signing [employer] managerno-harm contract and gave to BeninLatricia, RN.   - Cyndie MullAnna Meko Bellanger, [facility]   Therapeutic Triage    Ext [phone]

## 2015-03-24 ENCOUNTER — Ambulatory Visit: Payer: Self-pay | Admitting: Physician Assistant

## 2015-03-27 ENCOUNTER — Ambulatory Visit (INDEPENDENT_AMBULATORY_CARE_PROVIDER_SITE_OTHER): Payer: Managed Care, Other (non HMO) | Admitting: Physician Assistant

## 2015-03-27 ENCOUNTER — Encounter: Payer: Self-pay | Admitting: Physician Assistant

## 2015-03-27 VITALS — BP 118/60 | HR 92 | Temp 97.7°F | Ht 70.5 in | Wt 212.6 lb

## 2015-03-27 DIAGNOSIS — F988 Other specified behavioral and emotional disorders with onset usually occurring in childhood and adolescence: Secondary | ICD-10-CM

## 2015-03-27 DIAGNOSIS — F909 Attention-deficit hyperactivity disorder, unspecified type: Secondary | ICD-10-CM | POA: Diagnosis not present

## 2015-03-27 DIAGNOSIS — B002 Herpesviral gingivostomatitis and pharyngotonsillitis: Secondary | ICD-10-CM

## 2015-03-27 MED ORDER — VALACYCLOVIR HCL 1 G PO TABS: ORAL_TABLET | ORAL | Status: DC

## 2015-03-27 MED ORDER — AMPHETAMINE-DEXTROAMPHETAMINE 20 MG PO TABS: 20.0000 mg | ORAL_TABLET | Freq: Two times a day (BID) | ORAL | Status: DC

## 2015-03-27 NOTE — Progress Notes (Signed)
Patient presents to clinic today c/o for follow-up of ADD. Patient is currently on regimen on Adderall 20 mg. Takes as directed only on weekdays. Endorses well-controlled on this regimen for some time. Denies side effect of medication. Patient previously followed by Psychiatry for this diagnosis. Is establishing with a new provider in a few weeks who will take back over chronic ADD and Bipolar medications.   Patient also with history of oral HSV infection, occurring infrequently. Is requesting an Rx of Valtrex to keep at home to use when outbreaks occur. Past Medical History  Diagnosis Date  . SVT (supraventricular tachycardia) ([place])     Pacemaker  . Vaso vagal episode   . History of chicken pox   . Acid reflux   . Sciatica of left side   . Bulging lumbar disc     L5    Current Outpatient Prescriptions on File Prior to Visit  Medication Sig Dispense Refill  . ARIPiprazole (ABILIFY) 5 MG tablet Take 5 mg by mouth daily.    . finasteride (PROSCAR) 5 MG tablet TAKE 1/2 TAB BY MOUTH DAILY  3  . FLUoxetine (PROZAC) 40 MG capsule Take 40 mg by mouth daily.     Marland Kitchen ibuprofen (ADVIL,MOTRIN) 200 MG tablet Take 400 mg by mouth every 6 (six) hours as needed for headache, mild pain or moderate pain.     Marland Kitchen lamoTRIgine (LAMICTAL) 100 MG tablet Take 100 mg by mouth at bedtime.   1   No current facility-administered medications on file prior to visit.    No Known Allergies  Family History  Problem Relation Age of Onset  . Hypertension Mother     Living  . Other Mother     Hyperglycemia  . Cancer Father     Living - Leukemia  . Heart attack Father   . Heart disease Father   . Cancer Maternal Grandfather     Leukemia  . Cancer Paternal Grandmother     Oral Cancer  . Lung cancer Paternal Uncle     x4  . Thyroid disease Sister     #1  . Gallstones Sister     #2  . Allergies Son   . Healthy Daughter     x1    Social History   Social History  . Marital Status: Single    Spouse  Name: N/A  . Number of Children: 2  . Years of Education: N/A   Occupational History  . 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 Smoking status: Former Smoker    Types: Cigarettes  . Smokeless tobacco: Never Used     Comment: QUIT 6 YEARS AGO  . Alcohol Use: No  . Drug Use: No  . Sexual Activity:    Partners: Female   Other Topics Concern  . None   Social History Narrative    Review of Systems - See HPI.  All other ROS are negative.  BP 118/60 mmHg  Pulse 92  Temp(Src) 97.7 F (36.5 C) (Oral)  Ht 5' 10.5" (1.791 m)  Wt 212 lb 9.6 oz (96.435 kg)  BMI 30.06 kg/m2  SpO2 99%  Physical Exam  Constitutional: He is well-developed, well-nourished, and in no distress.  HENT:  Head: Normocephalic and atraumatic.  Eyes: Conjunctivae are normal.  Cardiovascular: Normal rate, regular rhythm, normal heart sounds and intact distal pulses.   Pulmonary/Chest: Effort normal and breath sounds normal. No respiratory distress. He has no  wheezes. He has no rales. He exhibits no tenderness.  Skin: Skin is warm and dry.  Psychiatric: Affect normal.  Vitals reviewed.   Recent Results (from the past 2160 hour(s))  Comprehensive metabolic panel     Status: Abnormal   Collection Time: 03/17/15  7:56 PM  Result Value Ref Range   Sodium 141 135 - 145 mmol/L   Potassium 3.6 3.5 - 5.1 mmol/L   Chloride 107 101 - 111 mmol/L   CO2 23 22 - 32 mmol/L   Glucose, Bld 111 (H) 65 - 99 mg/dL   BUN 16 6 - 20 mg/dL   Creatinine, Ser 1.02 0.61 - 1.24 mg/dL   Calcium 9.6 8.9 - 10.3 mg/dL   Total Protein 7.4 6.5 - 8.1 g/dL   Albumin 4.4 3.5 - 5.0 g/dL   AST 58 (H) 15 - 41 U/L   ALT 50 17 - 63 U/L   Alkaline Phosphatase 69 38 - 126 U/L   Total Bilirubin 0.6 0.3 - 1.2 mg/dL   GFR calc non Af Amer >60 >60 mL/min   GFR calc Af Amer >60 >60 mL/min    Comment: (NOTE) The eGFR has been calculated using the CKD EPI equation. This calculation has not been validated in all clinical  situations. eGFR's persistently <60 mL/min signify possible Chronic Kidney Disease.    Anion gap 11 5 - 15  Ethanol (ETOH)     Status: None   Collection Time: 03/17/15  7:56 PM  Result Value Ref Range   Alcohol, Ethyl (B) <5 <5 mg/dL    Comment:        LOWEST DETECTABLE LIMIT FOR SERUM ALCOHOL IS 5 mg/dL FOR MEDICAL PURPOSES ONLY   Salicylate level     Status: None   Collection Time: 03/17/15  7:56 PM  Result Value Ref Range   Salicylate Lvl <3.8 2.8 - 30.0 mg/dL  Acetaminophen level     Status: Abnormal   Collection Time: 03/17/15  7:56 PM  Result Value Ref Range   Acetaminophen (Tylenol), Serum <10 (L) 10 - 30 ug/mL    Comment:        THERAPEUTIC CONCENTRATIONS VARY SIGNIFICANTLY. A RANGE OF 10-30 ug/mL MAY BE AN EFFECTIVE CONCENTRATION FOR MANY PATIENTS. HOWEVER, SOME ARE BEST TREATED AT CONCENTRATIONS OUTSIDE THIS RANGE. ACETAMINOPHEN CONCENTRATIONS >150 ug/mL AT 4 HOURS AFTER INGESTION AND >50 ug/mL AT 12 HOURS AFTER INGESTION ARE OFTEN ASSOCIATED WITH TOXIC REACTIONS.   CBC     Status: None   Collection Time: 03/17/15  7:56 PM  Result Value Ref Range   WBC 10.5 4.0 - 10.5 K/uL   RBC 4.78 4.22 - 5.81 MIL/uL   Hemoglobin 14.5 13.0 - 17.0 g/dL   HCT 41.9 39.0 - 52.0 %   MCV 87.7 78.0 - 100.0 fL   MCH 30.3 26.0 - 34.0 pg   MCHC 34.6 30.0 - 36.0 g/dL   RDW 13.2 11.5 - 15.5 %   Platelets 265 150 - 400 K/uL    Assessment/Plan: Oral herpes Will grant Rx for Valtrex for outbreaks. Dosing instructions reviewed with patient.  ADD (attention deficit disorder) Vitals stable. Will refill medication. Patient is to have new specialist take over Rx once established.

## 2015-03-27 NOTE — Progress Notes (Signed)
Pre visit review using our clinic review tool, if applicable. No additional management support is needed unless otherwise documented below in the visit note. 

## 2015-03-27 NOTE — Patient Instructions (Signed)
Please follow-up with your new psychiatrist as scheduled. They will take over your ADD and other medications for mood. I have written you a one-month script of Adderall until they see you.  I have also sent in a script for Valtrex to take as directed for cold sores.

## 2015-03-29 DIAGNOSIS — B002 Herpesviral gingivostomatitis and pharyngotonsillitis: Secondary | ICD-10-CM | POA: Insufficient documentation

## 2015-03-29 DIAGNOSIS — F988 Other specified behavioral and emotional disorders with onset usually occurring in childhood and adolescence: Secondary | ICD-10-CM | POA: Insufficient documentation

## 2015-03-29 NOTE — Assessment & Plan Note (Signed)
Will grant Rx for Valtrex for outbreaks. Dosing instructions reviewed with patient.

## 2015-03-29 NOTE — Assessment & Plan Note (Signed)
Vitals stable. Will refill medication. Patient is to have new specialist take over Rx once established.

## 2015-04-19 ENCOUNTER — Encounter (HOSPITAL_COMMUNITY): Payer: Self-pay | Admitting: *Deleted

## 2015-04-19 ENCOUNTER — Emergency Department (HOSPITAL_COMMUNITY)
Admission: EM | Admit: 2015-04-19 | Discharge: 2015-04-19 | Disposition: A | Discharge status: Home / Self Care | Payer: Managed Care, Other (non HMO) | Attending: Emergency Medicine | Admitting: Emergency Medicine

## 2015-04-19 DIAGNOSIS — Z87891 Personal history of nicotine dependence: Secondary | ICD-10-CM | POA: Insufficient documentation

## 2015-04-19 DIAGNOSIS — Z8619 Personal history of other infectious and parasitic diseases: Secondary | ICD-10-CM | POA: Diagnosis not present

## 2015-04-19 DIAGNOSIS — J029 Acute pharyngitis, unspecified: Secondary | ICD-10-CM | POA: Diagnosis present

## 2015-04-19 DIAGNOSIS — Z8719 Personal history of other diseases of the digestive system: Secondary | ICD-10-CM | POA: Insufficient documentation

## 2015-04-19 DIAGNOSIS — Z8679 Personal history of other diseases of the circulatory system: Secondary | ICD-10-CM | POA: Insufficient documentation

## 2015-04-19 DIAGNOSIS — J039 Acute tonsillitis, unspecified: Secondary | ICD-10-CM | POA: Diagnosis not present

## 2015-04-19 DIAGNOSIS — Z950 Presence of cardiac pacemaker: Secondary | ICD-10-CM | POA: Diagnosis not present

## 2015-04-19 DIAGNOSIS — M5432 Sciatica, left side: Secondary | ICD-10-CM | POA: Insufficient documentation

## 2015-04-19 DIAGNOSIS — Z79899 Other long term (current) drug therapy: Secondary | ICD-10-CM | POA: Insufficient documentation

## 2015-04-19 HISTORY — DX: Peritonsillar abscess: J36

## 2015-04-19 MED ORDER — PENICILLIN G BENZATHINE 1200000 UNIT/2ML IM SUSP
1.2000 10*6.[IU] | Freq: Once | INTRAMUSCULAR | Status: AC
  Administered 2015-04-19: 1.2 10*6.[IU] via INTRAMUSCULAR
  Filled 2015-04-19: qty 2

## 2015-04-19 MED ORDER — DEXAMETHASONE SODIUM PHOSPHATE 10 MG/ML IJ SOLN
10.0000 mg | Freq: Once | INTRAMUSCULAR | Status: AC
  Administered 2015-04-19: 10 mg via INTRAMUSCULAR
  Filled 2015-04-19: qty 1

## 2015-04-19 NOTE — ED Notes (Signed)
Pt reports sore throat since Wednesday, went to UC and was put on abx and tamiflu.  Negative for flu and strep.  Pt reports white patches on both tonsils.  Pt reports hx of peritonsilar abscess about a year ago.

## 2015-04-19 NOTE — ED Provider Notes (Signed)
CSN: [id number]     Arrival date & time 04/19/15  4098 History   First MD Initiated Contact with Patient 04/19/15 0912     Chief Complaint  Patient presents with  . Sore Throat     (Consider location/radiation/quality/duration/timing/severity/associated sxs/prior Treatment) HPI Patient presents with sore throat starting on Thursday. He went to urgent care on Wednesday with fever chills and myalgias. Had negative flu swab of the time but started on Tamiflu. Develop sore throat overnight and went back to the urgent care. Had negative strep swab that started on Cefdinir. He's had 2 days of antibiotics with persistent throat pain. States it hurts to swallow. Myalgias, , fever and chills up improved. He's been taking ibuprofen regularly at home. Last dose was this morning around 7:30. The voice changes or difficulty breathing. Patient has had peritonsillar abscesses requiring drainage in the past. Most recently a year ago performed by Dr. Cloria Spring. Patient states he gets tonsillitis one to 2 times a year. Past Medical History  Diagnosis Date  . SVT (supraventricular tachycardia) ([facility])     Pacemaker  . Vaso vagal episode   . History of chicken pox   . Acid reflux   . Sciatica of left side   . Bulging lumbar disc     L5  . Peritonsillar abscess    Past Surgical History  Procedure Laterality Date  . Pacemaker insertion  2014  . Wisdom tooth extraction    . Spinal injections     Family History  Problem Relation Age of Onset  . Hypertension Mother     Living  . Other Mother     Hyperglycemia  . Cancer Father     Living - Leukemia  . Heart attack Father   . Heart disease Father   . Cancer Maternal Grandfather     Leukemia  . Cancer Paternal Grandmother     Oral Cancer  . Lung cancer Paternal Uncle     x4  . Thyroid disease Sister     #1  . Gallstones Sister     #2  . Allergies Son   . Healthy Daughter     x1   Social History  Substance Use Topics  . Smoking status: Former  Smoker    Types: Cigarettes  . Smokeless tobacco: Never Used     Comment: QUIT 6 YEARS AGO  . Alcohol Use: No    Review of Systems  Constitutional: Negative for fever and chills.  HENT: Positive for sore throat. Negative for drooling, rhinorrhea, trouble swallowing and voice change.   Respiratory: Negative for shortness of breath and stridor.   Cardiovascular: Negative for chest pain.  Gastrointestinal: Negative for nausea, vomiting and abdominal pain.  Musculoskeletal: Negative for myalgias and neck pain.  Skin: Negative for rash.  Neurological: Negative for dizziness, weakness, light-headedness, numbness and headaches.  All other systems reviewed and are negative.     Allergies  Clindamycin/lincomycin  Home Medications   Prior to Admission medications   Medication Sig Start Date End Date Taking? Authorizing Provider  amphetamine-dextroamphetamine (ADDERALL) 20 MG tablet Take 1 tablet (20 mg total) by mouth 2 (two) times daily. 03/27/15   Waldon Merl, PA-C  ARIPiprazole (ABILIFY) 5 MG tablet Take 5 mg by mouth daily.    Historical Provider, MD  finasteride (PROSCAR) 5 MG tablet TAKE 1/2 TAB BY MOUTH DAILY 03/11/15   Historical Provider, MD  FLUoxetine (PROZAC) 40 MG capsule Take 40 mg by mouth daily.  03/06/15   Historical  Provider, MD  ibuprofen (ADVIL,MOTRIN) 200 MG tablet Take 400 mg by mouth every 6 (six) hours as needed for headache, mild pain or moderate pain.     Historical Provider, MD  lamoTRIgine (LAMICTAL) 100 MG tablet Take 100 mg by mouth at bedtime.  03/11/15   Historical Provider, MD  valACYclovir (VALTREX) 1000 MG tablet Take 2 tablets (2000 mg) twice daily for 1 day at onset of outbreak. 03/27/15   Waldon MerlWilliam C Martin, PA-C   BP 120/73 mmHg  Pulse 76  Temp(Src) 98.1 F (36.7 C) (Oral)  Resp 16  SpO2 100% Physical Exam  Constitutional: He is oriented to person, place, and time. He appears well-developed and well-nourished. No distress.  HENT:  Head:  Normocephalic and atraumatic.  Mouth/Throat: Oropharynx is clear and moist.  Patient has bilateral tonsillar hypertrophy with patches noted. The right tonsil is slightly more prominent than the left but there is no obvious mass present. Airway is patent. Uvula is midline. Patient speaks in a clear voice.  Eyes: EOM are normal. Pupils are equal, round, and reactive to light.  Neck: Normal range of motion. Neck supple.  No meningismus  Cardiovascular: Normal rate and regular rhythm.  Exam reveals no gallop and no friction rub.   No murmur heard. Pulmonary/Chest: Effort normal and breath sounds normal. No stridor. No respiratory distress. He has no wheezes. He has no rales. He exhibits no tenderness.  Abdominal: Soft. Bowel sounds are normal.  Musculoskeletal: Normal range of motion. He exhibits no edema or tenderness.  Lymphadenopathy:    He has no cervical adenopathy.  Neurological: He is alert and oriented to person, place, and time.  Skin: Skin is warm and dry. No rash noted. No erythema.  Psychiatric: He has a normal mood and affect. His behavior is normal.  Nursing note and vitals reviewed.   ED Course  Procedures (including critical care time) Labs Review Labs Reviewed - No data to display  Imaging Review No results found. I have personally reviewed and evaluated these images and lab results as part of my medical decision-making.   EKG Interpretation None      MDM   Final diagnoses:  Tonsillitis with exudate    Patient with partially treated tonsillitis. Offered IM penicillin. He will follow-up with his ENT early next week if his symptoms do not improve. He understands the need to return immediately for any difficulty breathing or swallowing, voice changes, persistent fever, enlarged tonsil especially on the right.    Loren Raceravid Orie Baxendale, MD 04/19/15 [phone]

## 2015-04-19 NOTE — Discharge Instructions (Signed)
Call and make an appointment to follow-up with your ENT if your symptoms have not improved in 1-2 days' time. Return immediately if you have any worsening of your symptoms including difficulty swallowing or breathing, voice changes, increased size to the tonsils or for any concerns. You may take ibuprofen 600 mg every 4 hours for pain and fever.  Tonsillitis Tonsillitis is an infection of the throat that causes the tonsils to become red, tender, and swollen. Tonsils are collections of lymphoid tissue at the back of the throat. Each tonsil has crevices (crypts). Tonsils help fight nose and throat infections and keep infection from spreading to other parts of the body for the first 18 months of life.  CAUSES Sudden (acute) tonsillitis is usually caused by infection with streptococcal bacteria. Long-lasting (chronic) tonsillitis occurs when the crypts of the tonsils become filled with pieces of food and bacteria, which makes it easy for the tonsils to become repeatedly infected. SYMPTOMS  Symptoms of tonsillitis include:  A sore throat, with possible difficulty swallowing.  White patches on the tonsils.  Fever.  Tiredness.  New episodes of snoring during sleep, when you did not snore before.  Small, foul-smelling, yellowish-white pieces of material (tonsilloliths) that you occasionally cough up or spit out. The tonsilloliths can also cause you to have bad breath. DIAGNOSIS Tonsillitis can be diagnosed through a physical exam. Diagnosis can be confirmed with the results of lab tests, including a throat culture. TREATMENT  The goals of tonsillitis treatment include the reduction of the severity and duration of symptoms and prevention of associated conditions. Symptoms of tonsillitis can be improved with the use of steroids to reduce the swelling. Tonsillitis caused by bacteria can be treated with antibiotic medicines. Usually, treatment with antibiotic medicines is started before the cause of the  tonsillitis is known. However, if it is determined that the cause is not bacterial, antibiotic medicines will not treat the tonsillitis. If attacks of tonsillitis are severe and frequent, your health care provider may recommend surgery to remove the tonsils (tonsillectomy). HOME CARE INSTRUCTIONS   Rest as much as possible and get plenty of sleep.  Drink plenty of fluids. While the throat is very sore, eat soft foods or liquids, such as sherbet, soups, or instant breakfast drinks.  Eat frozen ice pops.  Gargle with a warm or cold liquid to help soothe the throat. Mix 1/4 teaspoon of salt and 1/4 teaspoon of baking soda in 8 oz of water. SEEK MEDICAL CARE IF:   Large, tender lumps develop in your neck.  A rash develops.  A green, yellow-brown, or bloody substance is coughed up.  You are unable to swallow liquids or food for 24 hours.  You notice that only one of the tonsils is swollen. SEEK IMMEDIATE MEDICAL CARE IF:   You develop any new symptoms such as vomiting, severe headache, stiff neck, chest pain, or trouble breathing or swallowing.  You have severe throat pain along with drooling or voice changes.  You have severe pain, unrelieved with recommended medications.  You are unable to fully open the mouth.  You develop redness, swelling, or severe pain anywhere in the neck.  You have a fever. MAKE SURE YOU:   Understand these instructions.  Will watch your condition.  Will get help right away if you are not doing well or get worse.   This information is not intended to replace advice given to you by your health care provider. Make sure you discuss any questions you have with  your health care provider.   Document Released: 09/29/2004 Document Revised: 01/10/2014 Document Reviewed: 06/08/2012 Elsevier Interactive Patient Education [employer]

## 2015-04-30 ENCOUNTER — Encounter: Payer: Self-pay | Admitting: Physician Assistant

## 2015-05-01 MED ORDER — LAMOTRIGINE 100 MG PO TABS: 100.0000 mg | ORAL_TABLET | Freq: Every day | ORAL | Status: DC

## 2015-05-01 MED ORDER — ARIPIPRAZOLE 2 MG PO TABS: 2.0000 mg | ORAL_TABLET | Freq: Every day | ORAL | Status: DC

## 2015-05-01 MED ORDER — FLUOXETINE HCL 40 MG PO CAPS: 40.0000 mg | ORAL_CAPSULE | Freq: Every day | ORAL | Status: DC

## 2015-05-01 MED ORDER — AMPHETAMINE-DEXTROAMPHETAMINE 20 MG PO TABS: 20.0000 mg | ORAL_TABLET | Freq: Two times a day (BID) | ORAL | Status: DC

## 2015-05-01 NOTE — Telephone Encounter (Signed)
Medication filled to pharmacy as requested. Adderall printed, to PCP for signature.

## 2015-05-01 NOTE — Telephone Encounter (Signed)
Ok to fill 90-day of all but Adderall. No refills. Can print 30-day of Adderall to pick up.  As far as psychiatry, please tell patient I like Dr. Emerson MonteParrish McKinney.

## 2015-05-25 ENCOUNTER — Other Ambulatory Visit: Payer: Self-pay | Admitting: Physician Assistant

## 2015-05-25 MED ORDER — AMPHETAMINE-DEXTROAMPHETAMINE 20 MG PO TABS: 20.0000 mg | ORAL_TABLET | Freq: Two times a day (BID) | ORAL | Status: DC

## 2015-05-25 NOTE — Telephone Encounter (Signed)
Requesting Adderall 20mg -Take 1 tablet by mouth 2 times daily. Last refill:05/01/15;#60,0 Last OV:03/27/15-for ADD UDS:Not done-Need Please advise.//AB/CMA

## 2015-06-08 ENCOUNTER — Other Ambulatory Visit: Payer: Self-pay | Admitting: Physician Assistant

## 2015-06-08 NOTE — Telephone Encounter (Signed)
Refill sent per [facility] refill protocol/SLS  

## 2015-07-09 ENCOUNTER — Telehealth: Payer: Self-pay | Admitting: Physician Assistant

## 2015-07-09 ENCOUNTER — Encounter: Payer: Self-pay | Admitting: Physician Assistant

## 2015-07-09 NOTE — Telephone Encounter (Signed)
Pt is requesting refill on Adderall.  Last OV: 03/27/2015 Last Fill: 05/25/2015 #60 and 0RF UDS: None  Will need UDS and CSC at time of pick up.  Please advise.

## 2015-07-10 MED ORDER — AMPHETAMINE-DEXTROAMPHETAMINE 20 MG PO TABS: 20.0000 mg | ORAL_TABLET | Freq: Two times a day (BID) | ORAL | Status: AC

## 2015-07-10 NOTE — Telephone Encounter (Signed)
Patient was to be establishing with new psychiatrist who was to take over management of this medication. We were filling until he established. Will allow one more moth fill -- he will have to give UDS sample at pickup.

## 2015-07-10 NOTE — Telephone Encounter (Signed)
Rx printed, awaiting MD signature.  

## 2015-07-10 NOTE — Telephone Encounter (Signed)
[facility] @ [phone]) asking the pt to RTC regarding medication refill request.//AB/CMA

## 2015-07-13 NOTE — Telephone Encounter (Addendum)
Spoke with the pt on (Friday-07/10/15) and informed him of the note below.  Pt verbalized understanding and agreed.  Pt stated that he had to change psychiatrist, so he has not gotten a new appointment yet.//AB/CMA

## 2015-07-20 ENCOUNTER — Telehealth: Payer: Self-pay | Admitting: Physician Assistant

## 2015-07-20 NOTE — Telephone Encounter (Signed)
UDS results are in and positive for THC (marijuana). Will allow this one time. He is not to test positive for this again while he is getting controlled substances from this office.

## 2015-07-20 NOTE — Telephone Encounter (Signed)
LMOM [mobile-home is 'no longer in service'] with contact name and number for return call RE: results per provider instructions/SLS 07/17

## 2015-07-23 ENCOUNTER — Telehealth: Payer: Self-pay | Admitting: Physician Assistant

## 2015-07-23 MED ORDER — LAMOTRIGINE 100 MG PO TABS: 100.0000 mg | ORAL_TABLET | Freq: Every day | ORAL | Status: DC

## 2015-07-23 MED ORDER — FLUOXETINE HCL 40 MG PO CAPS: 40.0000 mg | ORAL_CAPSULE | Freq: Every day | ORAL | Status: DC

## 2015-07-23 MED ORDER — ARIPIPRAZOLE 2 MG PO TABS: 2.0000 mg | ORAL_TABLET | Freq: Every day | ORAL | Status: DC

## 2015-07-23 NOTE — Addendum Note (Signed)
Addended by: Marcelline MatesMARTIN, Joycelyn Liska on: 07/23/2015 02:35 PM   Modules accepted: Orders

## 2015-07-23 NOTE — Telephone Encounter (Signed)
LMOM informing Pt that [facility] but further refills will need to come from psych. Instructed Pt to call if he has any further questions or concerns.

## 2015-07-23 NOTE — Telephone Encounter (Signed)
Patient was to establish with new psychiatrist. Rip Harbourk to fill 90-day supply. Further fills to come from specialist. Please call patient to discuss this.

## 2015-07-24 NOTE — Telephone Encounter (Signed)
Patient scheduled appt for 07/29/15 at 6:15pm/SLS

## 2015-07-27 ENCOUNTER — Other Ambulatory Visit: Payer: Self-pay | Admitting: Physician Assistant

## 2015-07-28 ENCOUNTER — Encounter: Payer: Self-pay | Admitting: Physician Assistant

## 2015-07-28 NOTE — Telephone Encounter (Signed)
I will take care of it for you. Take care.

## 2015-07-29 ENCOUNTER — Ambulatory Visit: Payer: Self-pay | Admitting: Physician Assistant

## 2015-08-05 ENCOUNTER — Encounter: Payer: Self-pay | Admitting: Physician Assistant

## 2015-08-07 ENCOUNTER — Other Ambulatory Visit: Payer: Self-pay | Admitting: Physician Assistant

## 2015-08-07 NOTE — Telephone Encounter (Signed)
Last Rx sent on 07/23/15, #90 with 0 refills, Too Soon for request/SLS 08/04

## 2019-04-07 ENCOUNTER — Emergency Department (HOSPITAL_COMMUNITY): Payer: 59

## 2019-04-07 ENCOUNTER — Encounter (HOSPITAL_COMMUNITY)
Admission: EM | Disposition: A | Discharge status: Home / Self Care | Payer: Self-pay | Source: Home / Self Care | Attending: Orthopedic Surgery

## 2019-04-07 ENCOUNTER — Inpatient Hospital Stay (HOSPITAL_COMMUNITY): Payer: 59 | Admitting: Certified Registered"

## 2019-04-07 ENCOUNTER — Encounter (HOSPITAL_COMMUNITY): Payer: Self-pay

## 2019-04-07 ENCOUNTER — Inpatient Hospital Stay (HOSPITAL_COMMUNITY): Payer: 59

## 2019-04-07 ENCOUNTER — Observation Stay (HOSPITAL_COMMUNITY)
Admission: EM | Admit: 2019-04-07 | Discharge: 2019-04-08 | Disposition: A | Discharge status: Home / Self Care | Payer: 59 | Attending: Orthopedic Surgery | Admitting: Orthopedic Surgery

## 2019-04-07 ENCOUNTER — Other Ambulatory Visit: Payer: Self-pay

## 2019-04-07 ENCOUNTER — Observation Stay (HOSPITAL_COMMUNITY): Payer: 59

## 2019-04-07 DIAGNOSIS — M79641 Pain in right hand: Secondary | ICD-10-CM | POA: Diagnosis not present

## 2019-04-07 DIAGNOSIS — Z885 Allergy status to narcotic agent status: Secondary | ICD-10-CM | POA: Diagnosis not present

## 2019-04-07 DIAGNOSIS — I471 Supraventricular tachycardia: Secondary | ICD-10-CM | POA: Diagnosis not present

## 2019-04-07 DIAGNOSIS — Z950 Presence of cardiac pacemaker: Secondary | ICD-10-CM | POA: Diagnosis not present

## 2019-04-07 DIAGNOSIS — K219 Gastro-esophageal reflux disease without esophagitis: Secondary | ICD-10-CM | POA: Insufficient documentation

## 2019-04-07 DIAGNOSIS — S82201A Unspecified fracture of shaft of right tibia, initial encounter for closed fracture: Secondary | ICD-10-CM

## 2019-04-07 DIAGNOSIS — S82892A Other fracture of left lower leg, initial encounter for closed fracture: Secondary | ICD-10-CM | POA: Diagnosis not present

## 2019-04-07 DIAGNOSIS — M7989 Other specified soft tissue disorders: Secondary | ICD-10-CM | POA: Insufficient documentation

## 2019-04-07 DIAGNOSIS — S0083XA Contusion of other part of head, initial encounter: Secondary | ICD-10-CM | POA: Diagnosis not present

## 2019-04-07 DIAGNOSIS — S92031A Displaced avulsion fracture of tuberosity of right calcaneus, initial encounter for closed fracture: Secondary | ICD-10-CM | POA: Diagnosis not present

## 2019-04-07 DIAGNOSIS — S82209A Unspecified fracture of shaft of unspecified tibia, initial encounter for closed fracture: Secondary | ICD-10-CM | POA: Diagnosis present

## 2019-04-07 DIAGNOSIS — Z20822 Contact with and (suspected) exposure to covid-19: Secondary | ICD-10-CM | POA: Diagnosis not present

## 2019-04-07 DIAGNOSIS — Z87891 Personal history of nicotine dependence: Secondary | ICD-10-CM | POA: Diagnosis not present

## 2019-04-07 DIAGNOSIS — S92025A Nondisplaced fracture of anterior process of left calcaneus, initial encounter for closed fracture: Secondary | ICD-10-CM

## 2019-04-07 DIAGNOSIS — S92102A Unspecified fracture of left talus, initial encounter for closed fracture: Secondary | ICD-10-CM

## 2019-04-07 DIAGNOSIS — M19071 Primary osteoarthritis, right ankle and foot: Secondary | ICD-10-CM | POA: Diagnosis not present

## 2019-04-07 DIAGNOSIS — Z881 Allergy status to other antibiotic agents status: Secondary | ICD-10-CM | POA: Insufficient documentation

## 2019-04-07 DIAGNOSIS — Z8249 Family history of ischemic heart disease and other diseases of the circulatory system: Secondary | ICD-10-CM | POA: Diagnosis not present

## 2019-04-07 DIAGNOSIS — Z79899 Other long term (current) drug therapy: Secondary | ICD-10-CM | POA: Diagnosis not present

## 2019-04-07 DIAGNOSIS — Z419 Encounter for procedure for purposes other than remedying health state, unspecified: Secondary | ICD-10-CM

## 2019-04-07 HISTORY — PX: TIBIA IM NAIL INSERTION: SHX2516

## 2019-04-07 HISTORY — DX: Presence of cardiac pacemaker: Z95.0

## 2019-04-07 LAB — CBC WITH DIFFERENTIAL/PLATELET
Abs Immature Granulocytes: 0.1 10*3/uL — ABNORMAL HIGH (ref 0.00–0.07)
Basophils Absolute: 0.1 10*3/uL (ref 0.0–0.1)
Basophils Relative: 0 %
Eosinophils Absolute: 0 10*3/uL (ref 0.0–0.5)
Eosinophils Relative: 0 %
HCT: 45.2 % (ref 39.0–52.0)
Hemoglobin: 15.4 g/dL (ref 13.0–17.0)
Immature Granulocytes: 1 %
Lymphocytes Relative: 9 %
Lymphs Abs: 1.3 10*3/uL (ref 0.7–4.0)
MCH: 32 pg (ref 26.0–34.0)
MCHC: 34.1 g/dL (ref 30.0–36.0)
MCV: 94 fL (ref 80.0–100.0)
Monocytes Absolute: 1 10*3/uL (ref 0.1–1.0)
Monocytes Relative: 8 %
Neutro Abs: 10.9 10*3/uL — ABNORMAL HIGH (ref 1.7–7.7)
Neutrophils Relative %: 82 %
Platelets: UNDETERMINED 10*3/uL (ref 150–400)
RBC: 4.81 MIL/uL (ref 4.22–5.81)
RDW: 12 % (ref 11.5–15.5)
WBC: 13.4 10*3/uL — ABNORMAL HIGH (ref 4.0–10.5)
nRBC: 0 % (ref 0.0–0.2)

## 2019-04-07 LAB — PROTIME-INR
INR: 1 (ref 0.8–1.2)
Prothrombin Time: 12.8 seconds (ref 11.4–15.2)

## 2019-04-07 LAB — BASIC METABOLIC PANEL
Anion gap: 11 (ref 5–15)
BUN: 9 mg/dL (ref 6–20)
CO2: 20 mmol/L — ABNORMAL LOW (ref 22–32)
Calcium: 7.9 mg/dL — ABNORMAL LOW (ref 8.9–10.3)
Chloride: 108 mmol/L (ref 98–111)
Creatinine, Ser: 0.8 mg/dL (ref 0.61–1.24)
GFR calc Af Amer: >60 mL/min (ref >60)
GFR calc non Af Amer: >60 mL/min (ref >60)
Glucose, Bld: 82 mg/dL (ref 70–99)
Potassium: 3.6 mmol/L (ref 3.5–5.1)
Sodium: 139 mmol/L (ref 135–145)

## 2019-04-07 LAB — RESPIRATORY PANEL BY RT PCR (FLU A&B, COVID)
Influenza A by PCR: NEGATIVE
Influenza B by PCR: NEGATIVE
SARS Coronavirus 2 by RT PCR: NEGATIVE

## 2019-04-07 LAB — ETHANOL: Alcohol, Ethyl (B): 143 mg/dL — ABNORMAL HIGH (ref <10)

## 2019-04-07 SURGERY — INSERTION, INTRAMEDULLARY ROD, TIBIA
Anesthesia: General | Site: Leg Lower | Laterality: Right

## 2019-04-07 MED ORDER — ONDANSETRON HCL 4 MG/2ML IJ SOLN
INTRAMUSCULAR | Status: AC
  Filled 2019-04-07: qty 2

## 2019-04-07 MED ORDER — PHENYLEPHRINE 40 MCG/ML (10ML) SYRINGE FOR IV PUSH (FOR BLOOD PRESSURE SUPPORT)
PREFILLED_SYRINGE | INTRAVENOUS | Status: DC | PRN
  Administered 2019-04-07: 80 ug via INTRAVENOUS
  Administered 2019-04-07: 40 ug via INTRAVENOUS
  Administered 2019-04-07: 80 ug via INTRAVENOUS
  Administered 2019-04-07: 40 ug via INTRAVENOUS
  Administered 2019-04-07 (×4): 80 ug via INTRAVENOUS

## 2019-04-07 MED ORDER — LACTATED RINGERS IV SOLN: INTRAVENOUS | Status: DC

## 2019-04-07 MED ORDER — EPINEPHRINE PF 1 MG/ML IJ SOLN
INTRAMUSCULAR | Status: AC
  Filled 2019-04-07: qty 1

## 2019-04-07 MED ORDER — ACETAMINOPHEN 500 MG PO TABS
ORAL_TABLET | ORAL | Status: AC
  Administered 2019-04-07: 1000 mg via ORAL
  Filled 2019-04-07: qty 2

## 2019-04-07 MED ORDER — ONDANSETRON HCL 4 MG PO TABS: 4.0000 mg | ORAL_TABLET | Freq: Four times a day (QID) | ORAL | Status: DC | PRN

## 2019-04-07 MED ORDER — DEXMEDETOMIDINE HCL IN NACL 200 MCG/50ML IV SOLN
INTRAVENOUS | Status: AC
  Filled 2019-04-07: qty 50

## 2019-04-07 MED ORDER — PHENYLEPHRINE 40 MCG/ML (10ML) SYRINGE FOR IV PUSH (FOR BLOOD PRESSURE SUPPORT)
PREFILLED_SYRINGE | INTRAVENOUS | Status: AC
  Filled 2019-04-07: qty 20

## 2019-04-07 MED ORDER — MIDAZOLAM HCL 5 MG/5ML IJ SOLN
INTRAMUSCULAR | Status: DC | PRN
  Administered 2019-04-07: 2 mg via INTRAVENOUS

## 2019-04-07 MED ORDER — LIDOCAINE 2% (20 MG/ML) 5 ML SYRINGE
INTRAMUSCULAR | Status: DC | PRN
  Administered 2019-04-07: 100 mg via INTRAVENOUS

## 2019-04-07 MED ORDER — FENTANYL CITRATE (PF) 250 MCG/5ML IJ SOLN
INTRAMUSCULAR | Status: AC
  Filled 2019-04-07: qty 5

## 2019-04-07 MED ORDER — PROMETHAZINE HCL 25 MG/ML IJ SOLN: 6.2500 mg | INTRAMUSCULAR | Status: DC | PRN

## 2019-04-07 MED ORDER — MORPHINE SULFATE (PF) 4 MG/ML IV SOLN
4.0000 mg | INTRAVENOUS | Status: DC | PRN
  Administered 2019-04-07 – 2019-04-08 (×3): 4 mg via INTRAVENOUS
  Filled 2019-04-07 (×3): qty 1

## 2019-04-07 MED ORDER — FENTANYL CITRATE (PF) 100 MCG/2ML IJ SOLN
INTRAMUSCULAR | Status: AC
  Filled 2019-04-07: qty 2

## 2019-04-07 MED ORDER — METHOCARBAMOL 500 MG PO TABS
500.0000 mg | ORAL_TABLET | Freq: Four times a day (QID) | ORAL | Status: DC | PRN
  Administered 2019-04-07 – 2019-04-08 (×2): 500 mg via ORAL
  Filled 2019-04-07 (×2): qty 1

## 2019-04-07 MED ORDER — SUGAMMADEX SODIUM 200 MG/2ML IV SOLN
INTRAVENOUS | Status: DC | PRN
  Administered 2019-04-07: 200 mg via INTRAVENOUS

## 2019-04-07 MED ORDER — ACETAMINOPHEN 500 MG PO TABS: 1000.0000 mg | ORAL_TABLET | ORAL | Status: AC

## 2019-04-07 MED ORDER — ONDANSETRON HCL 4 MG/2ML IJ SOLN
INTRAMUSCULAR | Status: DC | PRN
  Administered 2019-04-07: 4 mg via INTRAVENOUS

## 2019-04-07 MED ORDER — 0.9 % SODIUM CHLORIDE (POUR BTL) OPTIME
TOPICAL | Status: DC | PRN
  Administered 2019-04-07 (×2): 1000 mL

## 2019-04-07 MED ORDER — OXYCODONE HCL 5 MG PO TABS
5.0000 mg | ORAL_TABLET | ORAL | Status: DC | PRN
  Administered 2019-04-08: 5 mg via ORAL
  Filled 2019-04-07: qty 1

## 2019-04-07 MED ORDER — DOCUSATE SODIUM 100 MG PO CAPS
100.0000 mg | ORAL_CAPSULE | Freq: Two times a day (BID) | ORAL | Status: DC
  Administered 2019-04-07 – 2019-04-08 (×2): 100 mg via ORAL
  Filled 2019-04-07 (×2): qty 1

## 2019-04-07 MED ORDER — BUPIVACAINE HCL (PF) 0.5 % IJ SOLN
INTRAMUSCULAR | Status: AC
  Filled 2019-04-07: qty 30

## 2019-04-07 MED ORDER — POVIDONE-IODINE 10 % EX SWAB
2.0000 "application " | Freq: Once | CUTANEOUS | Status: AC
  Administered 2019-04-07: 2 via TOPICAL

## 2019-04-07 MED ORDER — PROPOFOL 10 MG/ML IV BOLUS
INTRAVENOUS | Status: AC
  Filled 2019-04-07: qty 40

## 2019-04-07 MED ORDER — MIDAZOLAM HCL 2 MG/2ML IJ SOLN
INTRAMUSCULAR | Status: AC
  Filled 2019-04-07: qty 2

## 2019-04-07 MED ORDER — ROCURONIUM BROMIDE 100 MG/10ML IV SOLN
INTRAVENOUS | Status: DC | PRN
  Administered 2019-04-07: 60 mg via INTRAVENOUS
  Administered 2019-04-07: 20 mg via INTRAVENOUS

## 2019-04-07 MED ORDER — METOCLOPRAMIDE HCL 5 MG/ML IJ SOLN: 5.0000 mg | Freq: Three times a day (TID) | INTRAMUSCULAR | Status: DC | PRN

## 2019-04-07 MED ORDER — ACETAMINOPHEN 500 MG PO TABS
1000.0000 mg | ORAL_TABLET | Freq: Four times a day (QID) | ORAL | Status: DC
  Administered 2019-04-07 – 2019-04-08 (×3): 1000 mg via ORAL
  Filled 2019-04-07 (×5): qty 2

## 2019-04-07 MED ORDER — CEFAZOLIN SODIUM-DEXTROSE 2-4 GM/100ML-% IV SOLN
2.0000 g | INTRAVENOUS | Status: AC
  Administered 2019-04-07: 14:00:00 2 g via INTRAVENOUS
  Filled 2019-04-07: qty 100

## 2019-04-07 MED ORDER — LIDOCAINE 2% (20 MG/ML) 5 ML SYRINGE
INTRAMUSCULAR | Status: AC
  Filled 2019-04-07: qty 5

## 2019-04-07 MED ORDER — MORPHINE SULFATE (PF) 4 MG/ML IV SOLN
INTRAVENOUS | Status: DC | PRN
  Administered 2019-04-07 (×2): 4 mg

## 2019-04-07 MED ORDER — CELECOXIB 200 MG PO CAPS
200.0000 mg | ORAL_CAPSULE | Freq: Two times a day (BID) | ORAL | Status: DC
  Administered 2019-04-07 – 2019-04-08 (×2): 200 mg via ORAL
  Filled 2019-04-07 (×3): qty 1

## 2019-04-07 MED ORDER — DEXAMETHASONE SODIUM PHOSPHATE 10 MG/ML IJ SOLN
INTRAMUSCULAR | Status: DC | PRN
  Administered 2019-04-07: 5 mg via INTRAVENOUS

## 2019-04-07 MED ORDER — BUPIVACAINE HCL (PF) 0.5 % IJ SOLN
INTRAMUSCULAR | Status: DC | PRN
  Administered 2019-04-07: 30 mL

## 2019-04-07 MED ORDER — METHOCARBAMOL 1000 MG/10ML IJ SOLN
500.0000 mg | Freq: Four times a day (QID) | INTRAVENOUS | Status: DC | PRN
  Filled 2019-04-07: qty 5

## 2019-04-07 MED ORDER — CHLORHEXIDINE GLUCONATE 4 % EX LIQD
60.0000 mL | Freq: Once | CUTANEOUS | Status: DC
  Filled 2019-04-07: qty 60

## 2019-04-07 MED ORDER — METOCLOPRAMIDE HCL 5 MG PO TABS: 5.0000 mg | ORAL_TABLET | Freq: Three times a day (TID) | ORAL | Status: DC | PRN

## 2019-04-07 MED ORDER — FENTANYL CITRATE (PF) 100 MCG/2ML IJ SOLN
25.0000 ug | INTRAMUSCULAR | Status: DC | PRN
  Administered 2019-04-07 (×2): 50 ug via INTRAVENOUS

## 2019-04-07 MED ORDER — HYDROMORPHONE HCL 1 MG/ML IJ SOLN
0.5000 mg | INTRAMUSCULAR | Status: DC | PRN
  Administered 2019-04-07: 0.5 mg via INTRAVENOUS
  Filled 2019-04-07: qty 1

## 2019-04-07 MED ORDER — MORPHINE SULFATE (PF) 4 MG/ML IV SOLN
INTRAVENOUS | Status: AC
  Filled 2019-04-07: qty 2

## 2019-04-07 MED ORDER — SODIUM CHLORIDE 0.9 % IV SOLN: INTRAVENOUS | Status: DC

## 2019-04-07 MED ORDER — CEFAZOLIN SODIUM-DEXTROSE 2-4 GM/100ML-% IV SOLN
2.0000 g | Freq: Three times a day (TID) | INTRAVENOUS | Status: AC
  Administered 2019-04-07 – 2019-04-08 (×3): 2 g via INTRAVENOUS
  Filled 2019-04-07 (×3): qty 100

## 2019-04-07 MED ORDER — ROCURONIUM BROMIDE 10 MG/ML (PF) SYRINGE
PREFILLED_SYRINGE | INTRAVENOUS | Status: AC
  Filled 2019-04-07: qty 10

## 2019-04-07 MED ORDER — CLONIDINE HCL (ANALGESIA) 100 MCG/ML EP SOLN
EPIDURAL | Status: DC | PRN
  Administered 2019-04-07: 100 ug

## 2019-04-07 MED ORDER — DEXAMETHASONE SODIUM PHOSPHATE 10 MG/ML IJ SOLN
INTRAMUSCULAR | Status: AC
  Filled 2019-04-07: qty 1

## 2019-04-07 MED ORDER — PROPOFOL 10 MG/ML IV BOLUS
INTRAVENOUS | Status: DC | PRN
  Administered 2019-04-07: 150 mg via INTRAVENOUS

## 2019-04-07 MED ORDER — OXYCODONE HCL 5 MG PO TABS
5.0000 mg | ORAL_TABLET | ORAL | Status: DC | PRN
  Administered 2019-04-07 – 2019-04-08 (×4): 10 mg via ORAL
  Filled 2019-04-07 (×4): qty 2

## 2019-04-07 MED ORDER — DEXMEDETOMIDINE HCL IN NACL 200 MCG/50ML IV SOLN
INTRAVENOUS | Status: DC | PRN
  Administered 2019-04-07: 8 ug via INTRAVENOUS
  Administered 2019-04-07 (×3): 4 ug via INTRAVENOUS

## 2019-04-07 MED ORDER — MORPHINE SULFATE (PF) 4 MG/ML IV SOLN
4.0000 mg | Freq: Once | INTRAVENOUS | Status: AC
  Administered 2019-04-07: 05:00:00 4 mg via INTRAVENOUS
  Filled 2019-04-07: qty 1

## 2019-04-07 MED ORDER — ONDANSETRON HCL 4 MG/2ML IJ SOLN: 4.0000 mg | Freq: Four times a day (QID) | INTRAMUSCULAR | Status: DC | PRN

## 2019-04-07 MED ORDER — FENTANYL CITRATE (PF) 250 MCG/5ML IJ SOLN
INTRAMUSCULAR | Status: DC | PRN
  Administered 2019-04-07 (×2): 50 ug via INTRAVENOUS
  Administered 2019-04-07: 100 ug via INTRAVENOUS
  Administered 2019-04-07 (×2): 25 ug via INTRAVENOUS

## 2019-04-07 MED ORDER — CLONIDINE HCL (ANALGESIA) 100 MCG/ML EP SOLN
EPIDURAL | Status: AC
  Filled 2019-04-07: qty 10

## 2019-04-07 SURGICAL SUPPLY — 75 items
BANDAGE ESMARK 6X9 LF (GAUZE/BANDAGES/DRESSINGS) IMPLANT
BIT DRILL 3.8X6 NS (BIT) ×4 IMPLANT
BIT DRILL 4.4 NS (BIT) ×2 IMPLANT
BLADE CLIPPER SURG (BLADE) ×2 IMPLANT
BLADE SURG 15 STRL LF DISP TIS (BLADE) ×1 IMPLANT
BLADE SURG 15 STRL SS (BLADE) ×3
BNDG CMPR 9X6 STRL LF SNTH (GAUZE/BANDAGES/DRESSINGS) ×1
BNDG CMPR MED 15X6 ELC VLCR LF (GAUZE/BANDAGES/DRESSINGS) ×1
BNDG COHESIVE 4X5 TAN STRL (GAUZE/BANDAGES/DRESSINGS) ×2 IMPLANT
BNDG COHESIVE 6X5 TAN STRL LF (GAUZE/BANDAGES/DRESSINGS) ×3 IMPLANT
BNDG ELASTIC 6X15 VLCR STRL LF (GAUZE/BANDAGES/DRESSINGS) ×2 IMPLANT
BNDG ESMARK 6X9 LF (GAUZE/BANDAGES/DRESSINGS) ×3
BNDG GAUZE ELAST 4 BULKY (GAUZE/BANDAGES/DRESSINGS) ×3 IMPLANT
CLOSURE WOUND 1/2 X4 (GAUZE/BANDAGES/DRESSINGS) ×1
COVER SURGICAL LIGHT HANDLE (MISCELLANEOUS) ×3 IMPLANT
COVER WAND RF STERILE (DRAPES) ×3 IMPLANT
DRAPE C-ARM 42X72 X-RAY (DRAPES) ×3 IMPLANT
DRAPE C-ARMOR (DRAPES) ×4 IMPLANT
DRAPE IMP U-DRAPE 54X76 (DRAPES) ×3 IMPLANT
DRAPE INCISE IOBAN 66X45 STRL (DRAPES) ×4 IMPLANT
DRAPE ORTHO SPLIT 77X108 STRL (DRAPES) ×6
DRAPE SURG ORHT 6 SPLT 77X108 (DRAPES) ×2 IMPLANT
DRAPE U-SHAPE 47X51 STRL (DRAPES) ×3 IMPLANT
DRSG AQUACEL AG ADV 3.5X 4 (GAUZE/BANDAGES/DRESSINGS) ×8 IMPLANT
DURAPREP 26ML APPLICATOR (WOUND CARE) ×5 IMPLANT
ELECT CAUTERY BLADE 6.4 (BLADE) ×2 IMPLANT
ELECT REM PT RETURN 9FT ADLT (ELECTROSURGICAL) ×3
ELECTRODE REM PT RTRN 9FT ADLT (ELECTROSURGICAL) ×1 IMPLANT
END CAP UNIVERSAL 5MM (Trauma) ×2 IMPLANT
GAUZE XEROFORM 5X9 LF (GAUZE/BANDAGES/DRESSINGS) ×3 IMPLANT
GLOVE BIOGEL PI IND STRL 7.0 (GLOVE) IMPLANT
GLOVE BIOGEL PI IND STRL 8 (GLOVE) ×1 IMPLANT
GLOVE BIOGEL PI INDICATOR 7.0 (GLOVE) ×4
GLOVE BIOGEL PI INDICATOR 8 (GLOVE) ×2
GLOVE ECLIPSE 7.0 STRL STRAW (GLOVE) ×4 IMPLANT
GLOVE ECLIPSE 8.0 STRL XLNG CF (GLOVE) ×3 IMPLANT
GOWN STRL REUS W/ TWL LRG LVL3 (GOWN DISPOSABLE) ×1 IMPLANT
GOWN STRL REUS W/ TWL XL LVL3 (GOWN DISPOSABLE) ×2 IMPLANT
GOWN STRL REUS W/TWL LRG LVL3 (GOWN DISPOSABLE) ×6
GOWN STRL REUS W/TWL XL LVL3 (GOWN DISPOSABLE) ×6
GUIDEPIN 3.2X17.5 THRD DISP (PIN) ×2 IMPLANT
GUIDEWIRE BALL NOSE 80CM (WIRE) ×2 IMPLANT
KIT BASIN OR (CUSTOM PROCEDURE TRAY) ×3 IMPLANT
KIT TURNOVER KIT B (KITS) ×3 IMPLANT
NAIL IM TIBIAL 10X34.5 (Orthopedic Implant) IMPLANT
NDL 18GX1X1/2 (RX/OR ONLY) (NEEDLE) IMPLANT
NDL FILTER BLUNT 18X1 1/2 (NEEDLE) IMPLANT
NEEDLE 18GX1X1/2 (RX/OR ONLY) (NEEDLE) ×3 IMPLANT
NEEDLE FILTER BLUNT 18X 1/2SAF (NEEDLE) ×2
NEEDLE FILTER BLUNT 18X1 1/2 (NEEDLE) ×1 IMPLANT
NS IRRIG 1000ML POUR BTL (IV SOLUTION) ×3 IMPLANT
PACK GENERAL/GYN (CUSTOM PROCEDURE TRAY) ×3 IMPLANT
PACK UNIVERSAL I (CUSTOM PROCEDURE TRAY) ×3 IMPLANT
PAD ARMBOARD 7.5X6 YLW CONV (MISCELLANEOUS) ×6 IMPLANT
PAD CAST 4YDX4 CTTN HI CHSV (CAST SUPPLIES) IMPLANT
PADDING CAST COTTON 4X4 STRL (CAST SUPPLIES) ×3
PADDING CAST COTTON 6X4 STRL (CAST SUPPLIES) ×2 IMPLANT
SCREW ACECAP 40MM (Screw) ×2 IMPLANT
SCREW ACECAP 44MM (Screw) ×2 IMPLANT
SCREW ACECAP 48MM (Screw) ×2 IMPLANT
SCREW PROXIMAL DEPUY (Screw) ×3 IMPLANT
SCREW PROXIMAL75MMLX5.5MM (Screw) ×2 IMPLANT
SCREW PRXML FT 50X5.5XLCK NS (Screw) IMPLANT
STOCKINETTE IMPERVIOUS LG (DRAPES) ×3 IMPLANT
STRIP CLOSURE SKIN 1/2X4 (GAUZE/BANDAGES/DRESSINGS) ×1 IMPLANT
SUT ETHILON 3 0 PS 1 (SUTURE) ×6 IMPLANT
SUT MNCRL AB 3-0 PS2 18 (SUTURE) ×2 IMPLANT
SUT VIC AB 0 CT1 27 (SUTURE) ×3
SUT VIC AB 0 CT1 27XBRD ANBCTR (SUTURE) ×1 IMPLANT
SUT VIC AB 2-0 CT1 27 (SUTURE) ×6
SUT VIC AB 2-0 CT1 TAPERPNT 27 (SUTURE) ×1 IMPLANT
SYR TB 1ML LUER SLIP (SYRINGE) ×2 IMPLANT
TIBIAL NAIL 10X34.5 (Orthopedic Implant) ×3 IMPLANT
TOWEL GREEN STERILE (TOWEL DISPOSABLE) ×5 IMPLANT
WATER STERILE IRR 1000ML POUR (IV SOLUTION) ×3 IMPLANT

## 2019-04-07 NOTE — Anesthesia Procedure Notes (Signed)
Procedure Name: Intubation Date/Time: 04/07/2019 1:54 PM Performed by: Janene Harvey, CRNA Pre-anesthesia Checklist: Patient identified, Emergency Drugs available, Suction available and Patient being monitored Patient Re-evaluated:Patient Re-evaluated prior to induction Oxygen Delivery Method: Circle system utilized Preoxygenation: Pre-oxygenation with 100% oxygen Induction Type: IV induction Ventilation: Mask ventilation without difficulty Laryngoscope Size: Mac and 4 Grade View: Grade II Tube type: Oral Tube size: 7.5 mm Number of attempts: 1 Airway Equipment and Method: Stylet and Oral airway Placement Confirmation: ETT inserted through vocal cords under direct vision,  positive ETCO2 and breath sounds checked- equal and bilateral Secured at: 23 cm Tube secured with: Tape Dental Injury: Teeth and Oropharynx as per pre-operative assessment
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
## 2019-04-07 NOTE — Progress Notes (Signed)
Orthopedic Tech Progress Note Patient Details:  Keith Underwood 1980-04-24 [id number]  Ortho Devices Type of Ortho Device: Post (long leg) splint Ortho Device/Splint Location: rle. ed nt helped hold leg. Ortho Device/Splint Interventions: Ordered, Application, Adjustment   Post Interventions Patient Tolerated: Well Instructions Provided: Care of device, Adjustment of device   Trinna Post 04/07/2019, 6:26 AM

## 2019-04-07 NOTE — Brief Op Note (Signed)
   04/07/2019  3:49 PM  PATIENT:  Keith Underwood  39 y.o. male  PRE-OPERATIVE DIAGNOSIS:  right tibia fracture  POST-OPERATIVE DIAGNOSIS:  right tibia fracture  PROCEDURE:  Procedure(s): INTRAMEDULLARY (IM) NAIL TIBIAL  SURGEON:  Surgeon(s): Cammy Copa, MD  ASSISTANT: magnant pa  ANESTHESIA:   general  EBL: 50 ml    Total I/O In: 1100 [I.V.:1000; IV Piggyback:100] Out: 350 [Urine:300; Blood:50]  BLOOD ADMINISTERED: none  DRAINS: none   LOCAL MEDICATIONS USED:  Marcaine mso4 clonidine  SPECIMEN:  No Specimen  COUNTS:  YES  TOURNIQUET:  * No tourniquets in log *  DICTATION: .Other Dictation: Dictation Number [id number]  PLAN OF CARE: Admit for overnight observation  PATIENT DISPOSITION:  PACU - hemodynamically stable
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
## 2019-04-07 NOTE — Anesthesia Postprocedure Evaluation (Signed)
Anesthesia Post Note  Patient: Keith Underwood  Procedure(s) Performed: INTRAMEDULLARY (IM) NAIL TIBIAL (Right Leg Lower)     Patient location during evaluation: PACU Anesthesia Type: General Level of consciousness: awake and alert Pain management: pain level controlled Vital Signs Assessment: post-procedure vital signs reviewed and stable Respiratory status: spontaneous breathing, nonlabored ventilation, respiratory function stable and patient connected to nasal cannula oxygen Cardiovascular status: blood pressure returned to baseline and stable Postop Assessment: no apparent nausea or vomiting Anesthetic complications: no    Last Vitals:  Vitals:   04/07/19 1656 04/07/19 2008  BP: 114/70 125/71  Pulse: 87 85  Resp: 18 14  Temp: 36.7 C 37.2 C  SpO2: 95% 98%    Last Pain:  Vitals:   04/07/19 2008  TempSrc: Oral  PainSc:                  Cecile Hearing
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
## 2019-04-07 NOTE — Progress Notes (Signed)
1700 Received pt from PACU, drowsy, responsive to verbal stimuli. RLE with ace wrap dry and intact, toes warm and mobile.

## 2019-04-07 NOTE — Anesthesia Preprocedure Evaluation (Addendum)
Anesthesia Evaluation  Patient identified by MRN, date of birth, ID band Patient awake    Reviewed: Allergy & Precautions, NPO status , Patient's Chart, lab work & pertinent test results  Airway Mallampati: III  TM Distance: >3 FB Neck ROM: Full    Dental  (+) Teeth Intact, Dental Advisory Given   Pulmonary former smoker,    Pulmonary exam normal breath sounds clear to auscultation       Cardiovascular + DVT  Normal cardiovascular exam+ dysrhythmias Supra Ventricular Tachycardia + pacemaker  Rhythm:Regular Rate:Normal     Neuro/Psych PSYCHIATRIC DISORDERS Bipolar Disorder  Neuromuscular disease    GI/Hepatic Neg liver ROS, GERD  ,  Endo/Other  Obesity   Renal/GU negative Renal ROS     Musculoskeletal negative musculoskeletal ROS (+)   Abdominal   Peds  Hematology  (+) HIV,   Anesthesia Other Findings Day of surgery medications reviewed with the patient.  Reproductive/Obstetrics                            Anesthesia Physical Anesthesia Plan  ASA: III and emergent  Anesthesia Plan: General   Post-op Pain Management:    Induction: Intravenous  PONV Risk Score and Plan: 2 and Midazolam, Dexamethasone and Ondansetron  Airway Management Planned: Oral ETT  Additional Equipment:   Intra-op Plan:   Post-operative Plan: Extubation in OR  Informed Consent: I have reviewed the patients History and Physical, chart, labs and discussed the procedure including the risks, benefits and alternatives for the proposed anesthesia with the patient or authorized representative who has indicated his/her understanding and acceptance.     Dental advisory given  Plan Discussed with: CRNA  Anesthesia Plan Comments:         Anesthesia Quick Evaluation

## 2019-04-07 NOTE — ED Notes (Signed)
Pt Mom Barnett Abu [phone]

## 2019-04-07 NOTE — Transfer of Care (Signed)
Immediate Anesthesia Transfer of Care Note  Patient: Keith Underwood  Procedure(s) Performed: INTRAMEDULLARY (IM) NAIL TIBIAL (Right Leg Lower)  Patient Location: PACU  Anesthesia Type:General  Level of Consciousness: drowsy  Airway & Oxygen Therapy: Patient Spontanous Breathing and Patient connected to face mask oxygen  Post-op Assessment: Report given to RN and Post -op Vital signs reviewed and stable  Post vital signs: Reviewed  Last Vitals:  Vitals Value Taken Time  BP 112/65 04/07/19 1620  Temp 36.8 C 04/07/19 1620  Pulse 77 04/07/19 1620  Resp 13 04/07/19 1620  SpO2 100 % 04/07/19 1620    Last Pain:  Vitals:   04/07/19 1620  TempSrc:   PainSc: Asleep         Complications: No apparent anesthesia complications

## 2019-04-08 ENCOUNTER — Encounter: Payer: Self-pay | Admitting: *Deleted

## 2019-04-08 LAB — CBC
HCT: 37.7 % — ABNORMAL LOW (ref 39.0–52.0)
Hemoglobin: 12.7 g/dL — ABNORMAL LOW (ref 13.0–17.0)
MCH: 31.8 pg (ref 26.0–34.0)
MCHC: 33.7 g/dL (ref 30.0–36.0)
MCV: 94.3 fL (ref 80.0–100.0)
Platelets: 228 10*3/uL (ref 150–400)
RBC: 4 MIL/uL — ABNORMAL LOW (ref 4.22–5.81)
RDW: 12.2 % (ref 11.5–15.5)
WBC: 10.7 10*3/uL — ABNORMAL HIGH (ref 4.0–10.5)
nRBC: 0 % (ref 0.0–0.2)

## 2019-04-08 MED ORDER — OXYCODONE HCL 5 MG PO TABS: 5.0000 mg | ORAL_TABLET | ORAL | 0 refills | Status: AC | PRN

## 2019-04-08 MED ORDER — INFLUENZA VAC SPLIT QUAD 0.5 ML IM SUSY: 0.5000 mL | PREFILLED_SYRINGE | INTRAMUSCULAR | Status: DC

## 2019-04-08 MED ORDER — METHOCARBAMOL 500 MG PO TABS: 500.0000 mg | ORAL_TABLET | Freq: Three times a day (TID) | ORAL | 0 refills | Status: AC | PRN
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
## 2019-04-08 NOTE — Progress Notes (Signed)
  Subjective: Pt is 39 y.o. Male who is POD1 s/p right tibial IM nail.  Pt is doing well, pain mostly controlled.  He has not been ambulating yet.  Right hand pain and left foot/ankle pain improving.    Objective: Vital signs in last 24 hours: Temp:  [97.6 F (36.4 C)-98.9 F (37.2 C)] 97.7 F (36.5 C) (04/05 0728) Pulse Rate:  [62-87] 75 (04/05 0728) Resp:  [11-18] 18 (04/05 0728) BP: (105-140)/(65-90) 140/90 (04/05 0728) SpO2:  [95 %-100 %] 100 % (04/05 0728)  Intake/Output from previous day: 04/04 0701 - 04/05 0700 In: 4652.6 [P.O.:720; I.V.:3532.1; IV Piggyback:400.5] Out: 3650 [Urine:3600; Blood:50] Intake/Output this shift: No intake/output data recorded.  Exam:  Right foot warm and well-perfused.  DF/PF intact to RLE.  Postop dressings in place without gross blood/drainage  Labs: Recent Labs    04/07/19 0423 04/08/19 0258  HGB 15.4 12.7*   Recent Labs    04/07/19 0423 04/08/19 0258  WBC 13.4* 10.7*  RBC 4.81 4.00*  HCT 45.2 37.7*  PLT PLATELET CLUMPS NOTED ON SMEAR, UNABLE TO ESTIMATE 228   Recent Labs    04/07/19 0423  NA 139  K 3.6  CL 108  CO2 20*  BUN 9  CREATININE 0.80  GLUCOSE 82  CALCIUM 7.9*   Recent Labs    04/07/19 0423  INR 1.0    Assessment/Plan: Plan for discharge home today. Patient is WBAT to RLE in a fracture boot. F/u with Dr. August Saucer in clinic in 7-10 days.  ASA for DVT prophylaxis.     Keith Underwood L Keith Underwood 04/08/2019, 8:20 AM

## 2019-04-08 NOTE — Progress Notes (Signed)
Orthopedic Tech Progress Note Patient Details:  Keith Underwood 08-Dec-1980 [id number]  Ortho Devices Type of Ortho Device: CAM walker Ortho Device/Splint Location: RLE Ortho Device/Splint Interventions: Ordered, Application   Post Interventions Patient Tolerated: Well Instructions Provided: Care of device   Donald Pore 04/08/2019, 9:42 AM

## 2019-04-08 NOTE — Plan of Care (Signed)

## 2019-04-08 NOTE — Progress Notes (Signed)
Pt reviewed AVS with RN and DME has been delivered. Pt dressed and belongings gathered by family at bedside. All questions answered to satisfaction. Pt taken down to family car to go home, instructed to follow up with dr.Dean's office once home.

## 2019-04-11 ENCOUNTER — Other Ambulatory Visit: Payer: Self-pay | Admitting: Surgical

## 2019-04-11 ENCOUNTER — Telehealth: Payer: Self-pay | Admitting: Orthopedic Surgery

## 2019-04-11 MED ORDER — ACETAMINOPHEN-CODEINE #3 300-30 MG PO TABS: 1.0000 | ORAL_TABLET | ORAL | 0 refills | Status: DC | PRN

## 2019-04-11 NOTE — Telephone Encounter (Signed)
Patient called advised the Oxycodone is making him nauseated. The number to contact patient is [phone]

## 2019-04-11 NOTE — Telephone Encounter (Signed)
Pls advise.  

## 2019-04-11 NOTE — Telephone Encounter (Signed)
Tried again, no answer. No VM to LM

## 2019-04-11 NOTE — Telephone Encounter (Signed)
Tried calling patient to discuss. No answer, No VM to LM.

## 2019-04-12 NOTE — Telephone Encounter (Signed)
I have tried reaching patient several times without success to discuss medication.

## 2019-04-17 ENCOUNTER — Ambulatory Visit (INDEPENDENT_AMBULATORY_CARE_PROVIDER_SITE_OTHER): Payer: 59 | Admitting: Orthopedic Surgery

## 2019-04-17 ENCOUNTER — Other Ambulatory Visit: Payer: Self-pay

## 2019-04-17 ENCOUNTER — Ambulatory Visit (INDEPENDENT_AMBULATORY_CARE_PROVIDER_SITE_OTHER): Payer: 59

## 2019-04-17 DIAGNOSIS — M79604 Pain in right leg: Secondary | ICD-10-CM
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
## 2019-04-19 ENCOUNTER — Other Ambulatory Visit: Payer: Self-pay | Admitting: Surgical

## 2019-04-19 ENCOUNTER — Encounter: Payer: Self-pay | Admitting: Orthopedic Surgery

## 2019-04-19 ENCOUNTER — Telehealth: Payer: Self-pay | Admitting: Orthopedic Surgery

## 2019-04-19 MED ORDER — ACETAMINOPHEN-CODEINE #3 300-30 MG PO TABS: 1.0000 | ORAL_TABLET | Freq: Four times a day (QID) | ORAL | 0 refills | Status: DC | PRN

## 2019-04-19 NOTE — Telephone Encounter (Signed)
Patient called.   He needs a refill on his Tylenol #3  Call back: [phone]

## 2019-04-19 NOTE — Telephone Encounter (Signed)
Pls advise. Thanks.  

## 2019-04-19 NOTE — Telephone Encounter (Signed)
Tried calling to advise meds sent. No answer.

## 2019-04-19 NOTE — Progress Notes (Signed)
Post-Op Visit Note   Patient: Keith Underwood           Date of Birth: Dec 17, 1980           MRN: [id number] Visit Date: 04/17/2019 PCP: Cathie Olden Marcello Fennel, MD   Assessment & Plan:  Chief Complaint:  Chief Complaint  Patient presents with  . Right Leg - Pain   Visit Diagnoses:  1. Pain in right leg     Plan: Keith Underwood is a patient is now about 2 weeks out right tibial IM nail along with multiple foot fractures in both feet.  He is doing better with pain.  Taking Tylenol 3.  He is actually weightbearing on the right leg and the left leg.  On exam incisions on the right leg of healed.  Mild swelling present in the foot.  On the right foot is got good ankle dorsiflexion plantarflexion inversion eversion strength.  On that left-hand side he has a little bit more swelling as well as some ecchymosis in the distal medial calf.  No real pain from the forefoot distally but does have pain in the ankle region.  Had a anterior process calcaneus fracture on that side.  Plan is to switch over the cam walker to the left foot.  2-week return and decision for or against MRI scanning of that ankle at that time.  He has a little bit more swelling than I would expect with the small fracture that he had.  Syndesmosis is stable bilaterally.  Taking aspirin for DVT prophylaxis.  Negative Homans and no calf tenderness today on exam bilaterally.  Follow-Up Instructions: No follow-ups on file.   Orders:  Orders Placed This Encounter  Procedures  . XR Tibia/Fibula Right   No orders of the defined types were placed in this encounter.   Imaging: No results found.  PMFS History: Patient Active Problem List   Diagnosis Date Noted  . Tibia fracture 04/07/2019  . Tibial fracture 04/07/2019  . ADD (attention deficit disorder) 03/29/2015  . Oral herpes 03/29/2015  . Visit for preventive health examination 09/19/2014  . Bipolar disorder with depression ([place]) 09/19/2014  . Deep vein thrombosis ([place]) 03/18/2013   . Supraventricular tachycardia ([place]) 03/18/2013  . Artificial cardiac pacemaker 11/09/2012  . Fam hx-ischem heart disease 11/22/2011   Past Medical History:  Diagnosis Date  . Acid reflux   . Bulging lumbar disc    L5  . History of chicken pox   . Peritonsillar abscess   . Presence of permanent cardiac pacemaker   . Sciatica of left side   . SVT (supraventricular tachycardia) ([place])    Pacemaker  . Vaso vagal episode     Family History  Problem Relation Age of Onset  . Hypertension Mother        Living  . Other Mother        Hyperglycemia  . Cancer Father        Living - Leukemia  . Heart attack Father   . Heart disease Father   . Cancer Maternal Grandfather        Leukemia  . Cancer Paternal Grandmother        Oral Cancer  . Lung cancer Paternal Uncle        x4  . Thyroid disease Sister        #1  . Gallstones Sister        #2  . Allergies Son   . Healthy Daughter  x1    Past Surgical History:  Procedure Laterality Date  . PACEMAKER INSERTION  2014  . SPINAL INJECTIONS    . TIBIA IM NAIL INSERTION Right 04/07/2019   Procedure: INTRAMEDULLARY (IM) NAIL TIBIAL;  Surgeon: Cammy Copa, MD;  Location: [facility] OR;  Service: Orthopedics;  Laterality: Right;  . WISDOM TOOTH EXTRACTION     Social History   Occupational History  . 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 Smoking status: Former Smoker    Types: Cigarettes  . Smokeless tobacco: Never Used  . Tobacco comment: QUIT 6 YEARS AGO  Substance and Sexual Activity  . Alcohol use: No    Alcohol/week: 0.0 standard drinks  . Drug use: No  . Sexual activity: Yes    Partners: Female

## 2019-04-19 NOTE — Telephone Encounter (Signed)
submitted

## 2019-04-29 ENCOUNTER — Telehealth: Payer: Self-pay | Admitting: Orthopedic Surgery

## 2019-04-29 ENCOUNTER — Other Ambulatory Visit: Payer: Self-pay | Admitting: Surgical

## 2019-04-29 MED ORDER — ACETAMINOPHEN-CODEINE #3 300-30 MG PO TABS: 1.0000 | ORAL_TABLET | Freq: Three times a day (TID) | ORAL | 0 refills | Status: AC | PRN

## 2019-04-29 NOTE — Telephone Encounter (Signed)
Pls advise. Thanks.  

## 2019-04-29 NOTE — Telephone Encounter (Signed)
IC LMVM advising.  

## 2019-04-29 NOTE — Telephone Encounter (Signed)
Patient called requesting an RX refill on his Tylenol #3.  Patient uses [facility] in [place] on [address].  CB#[phone].  Thank you.

## 2019-05-01 ENCOUNTER — Ambulatory Visit (INDEPENDENT_AMBULATORY_CARE_PROVIDER_SITE_OTHER): Payer: 59 | Admitting: Orthopedic Surgery

## 2019-05-01 ENCOUNTER — Other Ambulatory Visit: Payer: Self-pay

## 2019-05-01 ENCOUNTER — Encounter: Payer: Self-pay | Admitting: Orthopedic Surgery

## 2019-05-01 DIAGNOSIS — M79604 Pain in right leg: Secondary | ICD-10-CM

## 2019-05-01 NOTE — Progress Notes (Signed)
Post-Op Visit Note   Patient: Keith Underwood           Date of Birth: 1980/02/23           MRN: [id number] Visit Date: 05/01/2019 PCP: Cathie Olden Marcello Fennel, MD   Assessment & Plan:  Chief Complaint:  Chief Complaint  Patient presents with  . Right Leg - Follow-up   Visit Diagnoses:  1. Pain in right leg     Plan: Gen is a patient who underwent right tibial IM nail for 421.  Also has bilateral mildly displaced foot fractures.  Decision was for or against MRI scanning of the left foot today.  Overall he is doing better.  The right foot is now is slightly more symptomatic 1.  Tibial shaft is doing well.  All incisions have healed nicely.  Swelling has diminished.  Has predictable stiffness with tibiotalar dorsiflexion plantarflexion in both ankles.  His subtalar motion is improved on the left and still slightly stiff on the right.  Plantarflexion strength and Achilles tendon palpable bilaterally.  He is using the boot on the right-hand side now.  Okay for him to return to work Monday in a supportive boot type shoe.  I will think he needs to be in that fracture boot.  He is going to do diminished activity which is primarily sedentary.  We will see him back in mid May for repeat radiographs of both feet and the tibia.  Okay to continue weightbearing as tolerated.  Negative Homans no calf tenderness today in either leg.  Follow-Up Instructions: No follow-ups on file.   Orders:  No orders of the defined types were placed in this encounter.  No orders of the defined types were placed in this encounter.   Imaging: No results found.  PMFS History: Patient Active Problem List   Diagnosis Date Noted  . Tibia fracture 04/07/2019  . Tibial fracture 04/07/2019  . ADD (attention deficit disorder) 03/29/2015  . Oral herpes 03/29/2015  . Visit for preventive health examination 09/19/2014  . Bipolar disorder with depression ([place]) 09/19/2014  . Deep vein thrombosis ([place]) 03/18/2013  .  Supraventricular tachycardia ([place]) 03/18/2013  . Artificial cardiac pacemaker 11/09/2012  . Fam hx-ischem heart disease 11/22/2011   Past Medical History:  Diagnosis Date  . Acid reflux   . Bulging lumbar disc    L5  . History of chicken pox   . Peritonsillar abscess   . Presence of permanent cardiac pacemaker   . Sciatica of left side   . SVT (supraventricular tachycardia) ([place])    Pacemaker  . Vaso vagal episode     Family History  Problem Relation Age of Onset  . Hypertension Mother        Living  . Other Mother        Hyperglycemia  . Cancer Father        Living - Leukemia  . Heart attack Father   . Heart disease Father   . Cancer Maternal Grandfather        Leukemia  . Cancer Paternal Grandmother        Oral Cancer  . Lung cancer Paternal Uncle        x4  . Thyroid disease Sister        #1  . Gallstones Sister        #2  . Allergies Son   . Healthy Daughter        x1    Past Surgical History:  Procedure  Laterality Date  . PACEMAKER INSERTION  2014  . SPINAL INJECTIONS    . TIBIA IM NAIL INSERTION Right 04/07/2019   Procedure: INTRAMEDULLARY (IM) NAIL TIBIAL;  Surgeon: Cammy Copa, MD;  Location: [facility] OR;  Service: Orthopedics;  Laterality: Right;  . WISDOM TOOTH EXTRACTION     Social History   Occupational History  . 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 Smoking status: Former Smoker    Types: Cigarettes  . Smokeless tobacco: Never Used  . Tobacco comment: QUIT 6 YEARS AGO  Substance and Sexual Activity  . Alcohol use: No    Alcohol/week: 0.0 standard drinks  . Drug use: No  . Sexual activity: Yes    Partners: Female

## 2019-05-17 ENCOUNTER — Ambulatory Visit: Payer: 59 | Admitting: Orthopedic Surgery

## 2021-09-04 IMAGING — DX DG PELVIS 1-2V
1 series · 1 of 1 positions shown · non-contrast
Comparison: 10/22/2013

CLINICAL DATA: Pain

EXAM:
PELVIS - 1-2 VIEW

[pelvis ap]
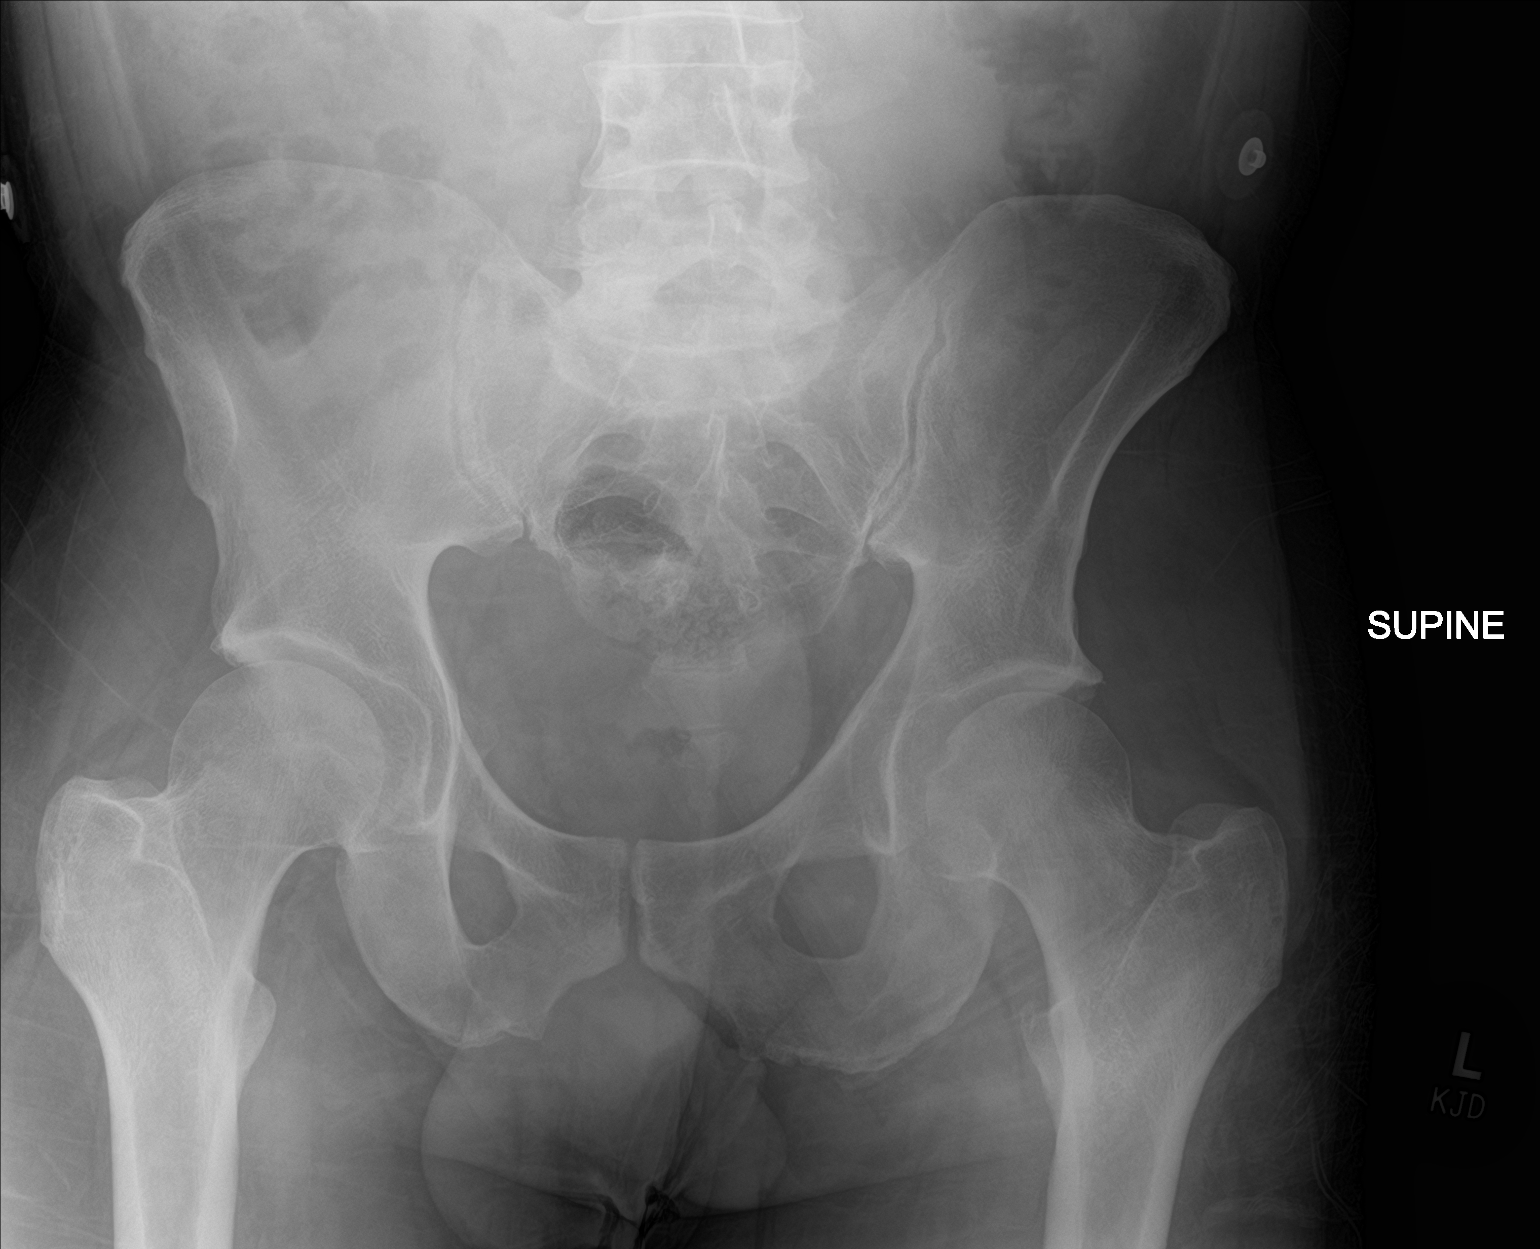

[1 of 1 positions shown; findings below may reference images not displayed]

FINDINGS: There is no evidence of pelvic fracture or diastasis. No pelvic bone
lesions are seen.
IMPRESSION: Negative.
